# Patient Record
Sex: Female | Born: 1945
Health system: Southern US, Community
[De-identification: ages and names within clinical notes are randomized; demographics above are authoritative.]

## PROBLEM LIST (undated history)

## (undated) DIAGNOSIS — E559 Vitamin D deficiency, unspecified: Secondary | ICD-10-CM

## (undated) DIAGNOSIS — D649 Anemia, unspecified: Secondary | ICD-10-CM

## (undated) DIAGNOSIS — N200 Calculus of kidney: Secondary | ICD-10-CM

## (undated) DIAGNOSIS — I10 Essential (primary) hypertension: Secondary | ICD-10-CM

## (undated) DIAGNOSIS — K649 Unspecified hemorrhoids: Secondary | ICD-10-CM

## (undated) DIAGNOSIS — R0989 Other specified symptoms and signs involving the circulatory and respiratory systems: Secondary | ICD-10-CM

## (undated) DIAGNOSIS — E785 Hyperlipidemia, unspecified: Secondary | ICD-10-CM

## (undated) DIAGNOSIS — D126 Benign neoplasm of colon, unspecified: Secondary | ICD-10-CM

## (undated) DIAGNOSIS — M1990 Unspecified osteoarthritis, unspecified site: Secondary | ICD-10-CM

## (undated) HISTORY — DX: Unspecified hemorrhoids: K64.9

## (undated) HISTORY — DX: Calculus of kidney: N20.0

## (undated) HISTORY — DX: Vitamin D deficiency, unspecified: E55.9

## (undated) HISTORY — PX: COLONOSCOPY: SHX174

## (undated) HISTORY — DX: Essential (primary) hypertension: I10

## (undated) HISTORY — PX: DENTAL SURGERY: SHX609

## (undated) HISTORY — DX: Unspecified osteoarthritis, unspecified site: M19.90

## (undated) HISTORY — DX: Other specified symptoms and signs involving the circulatory and respiratory systems: R09.89

## (undated) HISTORY — DX: Benign neoplasm of colon, unspecified: D12.6

## (undated) HISTORY — DX: Anemia, unspecified: D64.9

## (undated) HISTORY — DX: Hyperlipidemia, unspecified: E78.5

---

## 2000-08-08 ENCOUNTER — Encounter: Admission: RE | Admit: 2000-08-08 | Discharge: 2000-08-08 | Payer: Self-pay | Admitting: Internal Medicine

## 2005-01-04 ENCOUNTER — Ambulatory Visit: Payer: Self-pay | Admitting: Pulmonary Disease

## 2005-01-25 ENCOUNTER — Ambulatory Visit: Payer: Self-pay | Admitting: Pulmonary Disease

## 2006-03-28 ENCOUNTER — Ambulatory Visit: Payer: Self-pay | Admitting: Pulmonary Disease

## 2006-04-11 ENCOUNTER — Ambulatory Visit: Payer: Self-pay | Admitting: Pulmonary Disease

## 2007-07-24 ENCOUNTER — Telehealth (INDEPENDENT_AMBULATORY_CARE_PROVIDER_SITE_OTHER): Payer: Self-pay | Admitting: *Deleted

## 2007-07-25 ENCOUNTER — Encounter: Payer: Self-pay | Admitting: Pulmonary Disease

## 2007-08-21 ENCOUNTER — Ambulatory Visit: Payer: Self-pay | Admitting: Pulmonary Disease

## 2007-08-21 DIAGNOSIS — N200 Calculus of kidney: Secondary | ICD-10-CM | POA: Insufficient documentation

## 2007-08-21 DIAGNOSIS — D649 Anemia, unspecified: Secondary | ICD-10-CM | POA: Insufficient documentation

## 2007-08-21 DIAGNOSIS — M1990 Unspecified osteoarthritis, unspecified site: Secondary | ICD-10-CM | POA: Insufficient documentation

## 2007-08-21 DIAGNOSIS — I10 Essential (primary) hypertension: Secondary | ICD-10-CM | POA: Insufficient documentation

## 2008-07-15 ENCOUNTER — Telehealth (INDEPENDENT_AMBULATORY_CARE_PROVIDER_SITE_OTHER): Payer: Self-pay | Admitting: *Deleted

## 2008-11-25 ENCOUNTER — Ambulatory Visit: Payer: Self-pay | Admitting: Pulmonary Disease

## 2008-11-25 LAB — CONVERTED CEMR LAB
ALT: 28 units/L (ref 0–35)
AST: 22 units/L (ref 0–37)
BUN: 16 mg/dL (ref 6–23)
Basophils Absolute: 0 10*3/uL (ref 0.0–0.1)
Bilirubin, Direct: 0.1 mg/dL (ref 0.0–0.3)
Calcium: 9.3 mg/dL (ref 8.4–10.5)
Creatinine, Ser: 1.1 mg/dL (ref 0.4–1.2)
Eosinophils Relative: 3.3 % (ref 0.0–5.0)
GFR calc non Af Amer: 64.93 mL/min (ref >60)
Glucose, Bld: 95 mg/dL (ref 70–99)
Lymphs Abs: 1.7 10*3/uL (ref 0.7–4.0)
Monocytes Absolute: 0.5 10*3/uL (ref 0.1–1.0)
Monocytes Relative: 11 % (ref 3.0–12.0)
Neutrophils Relative %: 49.2 % (ref 43.0–77.0)
Platelets: 284 10*3/uL (ref 150.0–400.0)
RDW: 13.2 % (ref 11.5–14.6)
TSH: 0.97 microintl units/mL (ref 0.35–5.50)
Total Bilirubin: 0.4 mg/dL (ref 0.3–1.2)
WBC: 4.7 10*3/uL (ref 4.5–10.5)

## 2008-12-20 ENCOUNTER — Encounter: Payer: Self-pay | Admitting: Pulmonary Disease

## 2008-12-23 ENCOUNTER — Telehealth: Payer: Self-pay | Admitting: Pulmonary Disease

## 2008-12-31 LAB — CONVERTED CEMR LAB: Vit D, 25-Hydroxy: 26 ng/mL — ABNORMAL LOW (ref 30–89)

## 2009-01-06 ENCOUNTER — Ambulatory Visit: Payer: Self-pay | Admitting: Internal Medicine

## 2009-03-03 ENCOUNTER — Encounter: Payer: Self-pay | Admitting: Internal Medicine

## 2009-03-03 ENCOUNTER — Ambulatory Visit: Payer: Self-pay | Admitting: Internal Medicine

## 2009-03-06 ENCOUNTER — Encounter: Payer: Self-pay | Admitting: Internal Medicine

## 2009-03-21 DIAGNOSIS — Z860100 Personal history of colon polyps, unspecified: Secondary | ICD-10-CM | POA: Insufficient documentation

## 2009-05-26 ENCOUNTER — Ambulatory Visit: Payer: Self-pay | Admitting: Pulmonary Disease

## 2009-05-26 DIAGNOSIS — K649 Unspecified hemorrhoids: Secondary | ICD-10-CM | POA: Insufficient documentation

## 2009-05-26 DIAGNOSIS — E785 Hyperlipidemia, unspecified: Secondary | ICD-10-CM | POA: Insufficient documentation

## 2009-05-26 DIAGNOSIS — E559 Vitamin D deficiency, unspecified: Secondary | ICD-10-CM | POA: Insufficient documentation

## 2009-05-26 DIAGNOSIS — D126 Benign neoplasm of colon, unspecified: Secondary | ICD-10-CM | POA: Insufficient documentation

## 2009-05-26 HISTORY — DX: Unspecified hemorrhoids: K64.9

## 2009-06-09 ENCOUNTER — Telehealth: Payer: Self-pay | Admitting: Pulmonary Disease

## 2009-11-24 ENCOUNTER — Ambulatory Visit: Payer: Self-pay | Admitting: Pulmonary Disease

## 2009-11-25 LAB — CONVERTED CEMR LAB
ALT: 27 units/L (ref 0–35)
AST: 24 units/L (ref 0–37)
Basophils Absolute: 0 10*3/uL (ref 0.0–0.1)
Bilirubin, Direct: 0.1 mg/dL (ref 0.0–0.3)
CO2: 31 meq/L (ref 19–32)
Chloride: 106 meq/L (ref 96–112)
Cholesterol: 168 mg/dL (ref 0–200)
Creatinine, Ser: 1.2 mg/dL (ref 0.4–1.2)
Eosinophils Absolute: 0.1 10*3/uL (ref 0.0–0.7)
HDL: 38.3 mg/dL — ABNORMAL LOW (ref >39.00)
Lymphocytes Relative: 35 % (ref 12.0–46.0)
MCHC: 34.4 g/dL (ref 30.0–36.0)
MCV: 86.7 fL (ref 78.0–100.0)
Monocytes Absolute: 0.6 10*3/uL (ref 0.1–1.0)
Neutrophils Relative %: 48.8 % (ref 43.0–77.0)
Platelets: 288 10*3/uL (ref 150.0–400.0)
Potassium: 4.2 meq/L (ref 3.5–5.1)
RBC: 3.55 M/uL — ABNORMAL LOW (ref 3.87–5.11)
RDW: 14.6 % (ref 11.5–14.6)
Saturation Ratios: 31 % (ref 20.0–50.0)
Total Bilirubin: 0.5 mg/dL (ref 0.3–1.2)
Total CHOL/HDL Ratio: 4
Total Protein: 7.8 g/dL (ref 6.0–8.3)
Transferrin: 207.7 mg/dL — ABNORMAL LOW (ref 212.0–360.0)
Triglycerides: 66 mg/dL (ref 0.0–149.0)
Vit D, 25-Hydroxy: 45 ng/mL (ref 30–89)

## 2009-12-19 ENCOUNTER — Encounter: Payer: Self-pay | Admitting: Pulmonary Disease

## 2009-12-22 ENCOUNTER — Encounter: Payer: Self-pay | Admitting: Pulmonary Disease

## 2009-12-22 ENCOUNTER — Ambulatory Visit: Payer: Self-pay

## 2010-05-25 ENCOUNTER — Ambulatory Visit: Payer: Self-pay | Admitting: Pulmonary Disease

## 2010-05-25 DIAGNOSIS — R0989 Other specified symptoms and signs involving the circulatory and respiratory systems: Secondary | ICD-10-CM | POA: Insufficient documentation

## 2010-09-03 NOTE — Assessment & Plan Note (Signed)
Summary: 6 MONTHS/ MBW   Primary Care Provider:  Alroy Dust, MD  CC:  6 month ROV & review of mult medical problems....  History of Present Illness: 65 y/o BF here for a folllow up visit... she is [name] sister & followed for problems below...    ~  Apr10:  she is very intermittent w/ her ROV's and w/ her med refills... I called her Pharm- [facility] on [address]: Rx for both BP meds filled 7/29, 10/12, 11/19, 12/30, 2/1, 3/9... pt advised to refill meds on time and take them daily...   ~  May 26, 2009:  she has been taking her BP meds regularly & well controlled at [facility] checks & here today... she had her colonoscopy 8/10 w/ 15mm tubulovillous adenoma removed & I congratulated her on getting the test done "in the nick of time"... she still needs GYN eval for Pap, mammogram, BMD & she promises to get these done as well...   ~  November 24, 2009:  she hasn't yet seen her GYN for Pap, Mammogram, BMD (she promises to get these done- offered to sched Mammogram & BMD, but she declined & will get these done w/ GYN ASAP)... feeling well w/o new complaints or concerns... states she's taking meds regularly & BP is controlled on Rx;  FLP looks fair- we discussed low chol/ low fat diet;  mild anemia- continue Fe & Vits... she needs CDoppler screen- to be arranged.    Current Problems:   HYPERTENSION (ICD-401.9) - on METOPROLOL ER 50mg /d + LISINOPRIL/ HCT 20-12.5 daily... states she checks BP at the pharm and they are all OK... BP= 140/82 & reminded to take meds regularly... denies HA, fatigue, visual changes, CP, palipit, dizziness, syncope, dyspnea, edema, etc...   CAROTID BRUIT, LEFT (ICD-785.9) - on ASA 81mg /d... pt never went for her CDoppler that we scheduled in 2007... we discussed this & rec that she proceed w/ this screening test...  ~  CDoppler 4/11 =   HYPERCHOLESTEROLEMIA, BORDERLINE (ICD-272.4) - she is on a low cholesterol, low fat diet...  ~  FLP 9/07 showed TChol 166, TG 51, HDL 34,  LDL 121  ~  FLP 4/11 showed TChol 168, TG 66, HDL 38, LDL 117... discussed better diet, incr exercise.  COLONIC POLYPS (ICD-211.3) & HEMORRHOIDS - she had colonoscopy 8/10 by DrGessner w/ 15mm polyp at splenic flex= tubulovillous adenoma & f/u planned 60yrs... also had some hemorroids noted...  Hx of RENAL CALCULUS (ICD-592.0) - Rx by DrNesi in the past...  OSTEOARTHRITIS (ICD-715.90) - she uses OTC Tylenol arthritis...  VITAMIN D DEFICIENCY (ICD-268.9) - on Vit D 1000 u OTC daily.  ~  Vit D level 4/10 = 26... started on OTC Vit D 1000 u daily.  ~  labs 4/11 showed Vit D level = 45... continue OTC Rx.  ANEMIA (ICD-285.9) - last labs here 9/07 showed Hg= 11.1, MCV 87, Fe= 48 w/ Sat 16%... rec to take OTC Fe daily but not doing this any longer and hasn't had f/u blood work...   ~  labs 4/10 showed Hg= 11.3, MCV= 86... rec> continue Fe supplement daily.  ~  labs 4/11 showed Hg= 10.6, MCV= 87, Fe= 90 (30%)  HEALTH MAINTENANCE:  She needs GYN doctor and Pap, Mammogram, Baseline BMD, etc... she has promised to get a gynecologist for these important tests... finally had her colonoscopy w/ adenoma found & removed...   Allergies (verified): No Known Drug Allergies  Comments:  Nurse/Medical Assistant: The patient's medications  and allergies were reviewed with the patient and were updated in the Medication and Allergy Lists.  Past History:  Past Medical History: HYPERTENSION (ICD-401.9) CAROTID BRUIT, LEFT (ICD-785.9) HYPERCHOLESTEROLEMIA, BORDERLINE (ICD-272.4) COLONIC POLYPS (ICD-211.3) HEMORRHOIDS (ICD-455.6) Hx of RENAL CALCULUS (ICD-592.0) OSTEOARTHRITIS (ICD-715.90) VITAMIN D DEFICIENCY (ICD-268.9) ANEMIA (ICD-285.9)  Family History: Reviewed history from 05/26/2009 and no changes required. Father alive but estranged... Mother alive, age 9 hx DM, HBP... 2 Siblings-  1 Sister= [name], hx HBP, Obese... 1 Brother, Onalee Hua w/ HBP, hx stroke...  No FH of Colon  Cancer: Family History of Colon Polyps: Mother Family History of Diabetes: Mother  Social History: Reviewed history from 05/26/2009 and no changes required. Married, but separated... 2 Sons- Never smoked Social alcohol Employed at [employer]  Review of Systems      See HPI       The patient complains of dyspnea on exertion.  The patient denies anorexia, fever, weight loss, weight gain, vision loss, decreased hearing, hoarseness, chest pain, syncope, peripheral edema, prolonged cough, headaches, hemoptysis, abdominal pain, melena, hematochezia, severe indigestion/heartburn, hematuria, incontinence, muscle weakness, suspicious skin lesions, transient blindness, difficulty walking, depression, unusual weight change, abnormal bleeding, enlarged lymph nodes, and angioedema.    Vital Signs:  Patient profile:   65 year old female Height:      62 inches Weight:      175.13 pounds O2 Sat:      96 % on Room air Temp:     96.8 degrees F oral Pulse rate:   66 / minute BP sitting:   140 / 82  (right arm) Cuff size:   regular  Vitals Entered By: Randell Loop CMA (November 24, 2009 8:56 AM)  O2 Sat at Rest %:  96 O2 Flow:  Room air CC: 6 month ROV & review of mult medical problems... Is Patient Diabetic? No Pain Assessment Patient in pain? no      Comments NO CHANGES IN MEDS   Physical Exam  Additional Exam:  WD, WN, 64 y/o BF in NAD...  GENERAL:  Alert & oriented; pleasant & cooperative. HEENT:  /AT, EOM-wnl, PERRLA, EACs-clear, TMs-wnl, NOSE-clear, THROAT-clear & wnl. NECK:  Supple w/ fairROM; no JVD; sl diminished carotid impulses w/ faint L bruit, no thyromegaly or nodules palpated; no lymphadenopathy. CHEST:  Clear to P & A; without wheezes/ rales/ or rhonchi. HEART:  Regular Rhythm; without murmurs/ rubs/ or gallops. ABDOMEN:  Soft & nontender; normal bowel sounds; no organomegaly or masses detected. EXT: without deformities, mild arthritic changes; no varicose  veins/ venous insuffic/ or edema. NEURO:  CN's intact; motor testing normal; sensory testing normal; gait normal & balance OK. DERM:  No lesions noted; no rash etc...    MISC. Report  Procedure date:  11/24/2009  Findings:      BMP (METABOL)   Sodium                    145 mEq/L                   135-145   Potassium                 4.2 mEq/L                   3.5-5.1   Chloride                  106 mEq/L  96-112   Carbon Dioxide            31 mEq/L                    19-32   Glucose                   91 mg/dL                    16-10   BUN                       19 mg/dL                    9-60   Creatinine                1.2 mg/dL                   4.5-4.0   Calcium                   9.5 mg/dL                   9.8-11.9   GFR                       48.37 mL/min                >60  Hepatic/Liver Function Panel (HEPATIC)   Total Bilirubin           0.5 mg/dL                   1.4-7.8   Direct Bilirubin          0.1 mg/dL                   2.9-5.6   Alkaline Phosphatase      70 U/L                      39-117   AST                       24 U/L                      0-37   ALT                       27 U/L                      0-35   Total Protein             7.8 g/dL                    2.1-3.0   Albumin                   3.6 g/dL                    8.6-5.7  CBC Platelet w/Diff (CBCD)   White Cell Count          4.8 K/uL                    4.5-10.5   Red Cell Count       [L]  3.55 Mil/uL  3.87-5.11   Hemoglobin           [L]  10.6 g/dL                   09.8-11.9   Hematocrit           [L]  30.8 %                      36.0-46.0   MCV                       86.7 fl                     78.0-100.0   Platelet Count            288.0 K/uL                  150.0-400.0   Neutrophil %              48.8 %                      43.0-77.0   Lymphocyte %              35.0 %                      12.0-46.0   Monocyte %           [H]  12.3 %                       3.0-12.0   Eosinophils%              3.1 %                       0.0-5.0   Basophils %               0.8 %                       0.0-3.0  Comments:      Lipid Panel (LIPID)   Cholesterol               168 mg/dL                   1-478   Triglycerides             66.0 mg/dL                  2.9-562.1   HDL                  [L]  30.86 mg/dL                 >57.84   LDL Cholesterol      [H]  696 mg/dL                   2-95  TSH (TSH)   FastTSH                   0.87 uIU/mL                 0.35-5.50   IBC Panel (IBC)   Iron                      90 ug/dL  42-145   Transferrin          [L]  207.7 mg/dL                 161.0-960.4   Iron Saturation           31.0 %                      20.0-50.0  Vitamin D (25-Hydroxy) (54098)  Vitamin D (25-Hydroxy)                             45 ng/mL                    30-89   Impression & Recommendations:  Problem # 1:  HYPERTENSION (ICD-401.9) Controlled-  same meds... Her updated medication list for this problem includes:    Metoprolol Succinate 50 Mg Tb24 (Metoprolol succinate) .Marland Kitchen... 1 tab by mouth once daily    Lisinopril-hydrochlorothiazide 20-12.5 Mg Tabs (Lisinopril-hydrochlorothiazide) .Marland Kitchen... Take 1 tablet by mouth once a day  Orders: Prescription Created Electronically [phone]) TLB-BMP (Basic Metabolic Panel-BMET) (80048-METABOL) TLB-Hepatic/Liver Function Pnl (80076-HEPATIC) TLB-CBC Platelet - w/Differential (85025-CBCD) TLB-Lipid Panel (80061-LIPID) TLB-TSH (Thyroid Stimulating Hormone) (84443-TSH) TLB-IBC Pnl (Iron/FE;Transferrin) (83550-IBC) T-Vitamin D (25-Hydroxy) (78295-62130)  Problem # 2:  CAROTID BRUIT, LEFT (ICD-785.9) Needs CDoppler-  to be scheduled... Orders: Carotid Doppler (Carotid doppler)  Problem # 3:  HYPERCHOLESTEROLEMIA, BORDERLINE (ICD-272.4) Labs not too bad on diet alone-  needs to get weeight down!  Problem # 4:  COLONIC POLYPS (ICD-211.3) GI per DrGessner-  f/u colon 3  yrs.  Problem # 5:  VITAMIN D DEFICIENCY (ICD-268.9) Stable-  continue OTC Vit D...  Problem # 6:  ANEMIA (ICD-285.9) She has ACD-  continue Fe & Vits... Her updated medication list for this problem includes:    Slow Fe 142 (45 Fe) Mg Cr-tabs (Ferrous sulfate) .Marland Kitchen... Take 1 tab daily...  Complete Medication List: 1)  Aspirin Adult Low Strength 81 Mg Tbec (Aspirin) .... Take 1 tab by mouth once daily.Marland KitchenMarland Kitchen 2)  Metoprolol Succinate 50 Mg Tb24 (Metoprolol succinate) .Marland Kitchen.. 1 tab by mouth once daily 3)  Lisinopril-hydrochlorothiazide 20-12.5 Mg Tabs (Lisinopril-hydrochlorothiazide) .... Take 1 tablet by mouth once a day 4)  Vitamin D 1000 Unit Tabs (Cholecalciferol) .... Take 1 tablet by mouth once a day 5)  Slow Fe 142 (45 Fe) Mg Cr-tabs (Ferrous sulfate) .... Take 1 tab daily...  Patient Instructions: 1)  Today we updated your med list- see below.... 2)  We refilled your meds for 2011... 3)  Today we did your FASTING blood work...  please call the "phone tree" in a few days for your lab results.Marland KitchenMarland Kitchen 4)  We will also sched the screening carotid doppler exam (sound wave test of the blood flow in your neck arteries) & we will call w/ these results when avail.Marland KitchenMarland Kitchen 5)  Let's get on track w/ our diet + exercise program... the goal is to lose 15-20 lbs!!! 6)  Call for any questions.Marland KitchenMarland Kitchen 7)  Please schedule a follow-up appointment in 6 months. Prescriptions: LISINOPRIL-HYDROCHLOROTHIAZIDE 20-12.5 MG TABS (LISINOPRIL-HYDROCHLOROTHIAZIDE) Take 1 tablet by mouth once a day  #90 x prn   Entered and Authorized by:   Michele Mcalpine MD   Signed by:   Michele Mcalpine MD on 11/24/2009   Method used:   Print then Give to Patient   RxID:   [id number] METOPROLOL SUCCINATE 50 MG  TB24 (  METOPROLOL SUCCINATE) 1 tab by mouth once daily  #90 x prn   Entered and Authorized by:   Michele Mcalpine MD   Signed by:   Michele Mcalpine MD on 11/24/2009   Method used:   Print then Give to Patient   RxID:    [id number]    Immunization History:  Influenza Immunization History:    Influenza:  historical (06/18/2009)

## 2010-09-03 NOTE — Assessment & Plan Note (Signed)
Summary: 6 MONTH FOLLOW UP/LA   Primary Care Kellie Murrill:  Alroy Dust, MD  CC:  6 month ROV & review of mult medical problems....  History of Present Illness: 65 y/o BF here for a folllow up visit... she is [name] sister & followed for problems below...    ~  Apr10:  she has been very intermittent w/ her ROV's and w/ her med refills... I called her Pharm- [facility] on [address]: Rx for both BP meds filled 7/29, 10/12, 11/19, 12/30, 2/1, 3/9... pt advised to refill meds on time and take them daily...   ~  May 26, 2009:  she has been taking her BP meds regularly & well controlled at [facility] checks & here today... she had her colonoscopy 8/10 w/ 15mm tubulovillous adenoma removed & I congratulated her on getting the test done in a timely manner... she still needs GYN eval for Pap, mammogram, BMD & she promises to get these done as well...   ~  November 24, 2009:  she hasn't yet seen her GYN for Pap, Mammogram, BMD (she promises to get these done- offered to sched Mammogram & BMD, but she declined & will get these done w/ GYN ASAP)... feeling well w/o new complaints or concerns... states she's taking meds regularly & BP is controlled on Rx;  FLP looks fair- we discussed low chol/ low fat diet;  mild anemia- continue Fe & Vits... she needs CDoppler screen- done 5/11 & showed mild smooth plaque on the right w/ 40-59% RICA stenosis & normal on left (f/u rec 67yrs)...   ~  May 25, 2010:  she reports a good 21mo- only c/o some intermittent right knee pain when she has to stand for prolonged periods (rec to try knee brace & Aleve Prn)... BP stable on meds;  weight down 3# on diet, but still needs incr exercise, etc... she hasn't contacted DrNeal for GYN check up yet but promises to do this very soon...    Current Problems:   HYPERTENSION (ICD-401.9) - on METOPROLOL ER 50mg /d + LISINOPRIL 20mg /d, & HCTZ 12.5mg  daily... states she checks BP at the pharm and they are all OK... BP= 146/78 & reminded to  take meds regularly... denies HA, fatigue, visual changes, CP, palipit, dizziness, syncope, dyspnea, edema, etc...   CAROTID BRUIT (ICD-785.9) - on ASA 81mg /d... pt never went for her CDoppler that we scheduled in 2007... we discussed this & rec that she proceed w/ this screening test...  ~  CDoppler 5/11 showed mild smooth plaque on the right w/ 40-59% RICA stenosis & normal on left (f/u rec 65yrs)  HYPERCHOLESTEROLEMIA, BORDERLINE (ICD-272.4) - she is on a low cholesterol, low fat diet...  ~  FLP 9/07 showed TChol 166, TG 51, HDL 34, LDL 121  ~  FLP 4/11 showed TChol 168, TG 66, HDL 38, LDL 117... discussed better diet, incr exercise.  COLONIC POLYPS (ICD-211.3) & HEMORRHOIDS - she had colonoscopy 8/10 by DrGessner w/ 15mm polyp at splenic flex= tubulovillous adenoma & f/u planned 70yrs... also had some hemorroids noted...  Hx of RENAL CALCULUS (ICD-592.0) - Rx by DrNesi in the past...  OSTEOARTHRITIS (ICD-715.90) - she uses OTC Tylenol arthritis & we discussed knee brace & Aleve Prn.  VITAMIN D DEFICIENCY (ICD-268.9) - on Vit D 1000 u OTC daily.  ~  Vit D level 4/10 = 26... started on OTC Vit D 1000 u daily.  ~  labs 4/11 showed Vit D level = 45... continue OTC Rx.  ANEMIA (ICD-285.9) -  on SLOW-Fe daily...  ~  labs 9/07 showed Hg= 11.1, MCV 87, Fe= 48 w/ Sat 16%... rec OTC Fe daily but she stopped on her own.  ~  labs 4/10 showed Hg= 11.3, MCV= 86... rec> continue Fe supplement daily.  ~  labs 4/11 showed Hg= 10.6, MCV= 87, Fe= 90 (30%)... rec> continue same.  HEALTH MAINTENANCE:  She needs GYN doctor and Pap, Mammogram, Baseline BMD, etc... she has promised to get a gynecologist for these important tests... finally had her colonoscopy 8/10 w/ adenoma found & removed...   Allergies: No Known Drug Allergies  Comments:  Nurse/Medical Assistant: The patient's medications and allergies were reviewed with the patient and were updated in the Medication and Allergy Lists. Boone Master  CNA/MA  May 25, 2010 12:18 PM   Past History:  Past Medical History: HYPERTENSION (ICD-401.9) CAROTID BRUIT (ICD-785.9) HYPERCHOLESTEROLEMIA, BORDERLINE (ICD-272.4) COLONIC POLYPS (ICD-211.3) w/ +FamHx colon polyps in mother HEMORRHOIDS (ICD-455.6) Hx of RENAL CALCULUS (ICD-592.0) OSTEOARTHRITIS (ICD-715.90) VITAMIN D DEFICIENCY (ICD-268.9) ANEMIA (ICD-285.9)  Past Surgical History: no prev surgery  Family History: Reviewed history from 05/26/2009 and no changes required. Father alive but estranged... Mother alive, age 34 hx DM, HBP... 2 Siblings-  1 Sister= [name], hx HBP, Obese... 1 Brother, Onalee Hua w/ HBP, hx stroke...  No FH of Colon Cancer: Family History of Colon Polyps: Mother Family History of Diabetes: Mother  Social History: Reviewed history from 05/26/2009 and no changes required. Married, but separated... 2 Sons- Never smoked Social alcohol Employed at [employer]  Review of Systems      See HPI  The patient denies anorexia, fever, weight loss, weight gain, vision loss, decreased hearing, hoarseness, chest pain, syncope, dyspnea on exertion, peripheral edema, prolonged cough, headaches, hemoptysis, abdominal pain, melena, hematochezia, severe indigestion/heartburn, hematuria, incontinence, muscle weakness, suspicious skin lesions, transient blindness, difficulty walking, depression, unusual weight change, abnormal bleeding, enlarged lymph nodes, and angioedema.    Vital Signs:  Patient profile:   65 year old female Height:      62 inches Weight:      172.25 pounds BMI:     31.62 O2 Sat:      96 % on Room air Temp:     98.4 degrees F oral Pulse rate:   85 / minute BP sitting:   146 / 78  (right arm) Cuff size:   regular  Vitals Entered By: Boone Master CNA/MA (May 25, 2010 12:18 PM)  O2 Flow:  Room air  Physical Exam  Additional Exam:  WD, WN, 65 y/o BF in NAD...  GENERAL:  Alert & oriented; pleasant &  cooperative. HEENT:  [place]/AT, EOM-wnl, PERRLA, EACs-clear, TMs-wnl, NOSE-clear, THROAT-clear & wnl. NECK:  Supple w/ fairROM; no JVD; sl diminished carotid impulses w/ faint L bruit, no thyromegaly or nodules palpated; no lymphadenopathy. CHEST:  Clear to P & A; without wheezes/ rales/ or rhonchi. HEART:  Regular Rhythm; without murmurs/ rubs/ or gallops. ABDOMEN:  Soft & nontender; normal bowel sounds; no organomegaly or masses detected. EXT: without deformities, mild arthritic changes; no varicose veins/ venous insuffic/ or edema. NEURO:  CN's intact; motor testing normal; sensory testing normal; gait normal & balance OK. DERM:  No lesions noted; no rash etc...    Impression & Recommendations:  Problem # 1:  HYPERTENSION (ICD-401.9) Controlled>  same meds, keep wt down... Her updated medication list for this problem includes:    Metoprolol Succinate 50 Mg Tb24 (Metoprolol succinate) .Marland Kitchen... 1 tab by mouth once daily  Lisinopril 20 Mg Tabs (Lisinopril) .Marland Kitchen... Take 1 tablet by mouth once a day    Hydrochlorothiazide 12.5 Mg Caps (Hydrochlorothiazide) .Marland Kitchen... Take one capsule by mouth once daily  Problem # 2:  CAROTID BRUIT  (ICD-785.9) CDoppler w/ mild sm plaque on right, 40-59%, f/u study planned in 2 yrs... continue ASA 81mg /d.  Problem # 3:  HYPERCHOLESTEROLEMIA, BORDERLINE (ICD-272.4) On diet alone & we reviewed labs and diet/ exercise recs...  Problem # 4:  COLONIC POLYPS (ICD-211.3) Up to date on colon screening... she needs to call her GYN for Pap, Mammogram, BMD...  Problem # 5:  OTHER MEDICAL PROBLEMS AS NOTED>>>  Complete Medication List: 1)  Aspirin Adult Low Strength 81 Mg Tbec (Aspirin) .... Take 1 tab by mouth once daily.Marland KitchenMarland Kitchen 2)  Metoprolol Succinate 50 Mg Tb24 (Metoprolol succinate) .Marland Kitchen.. 1 tab by mouth once daily 3)  Lisinopril 20 Mg Tabs (Lisinopril) .... Take 1 tablet by mouth once a day 4)  Hydrochlorothiazide 12.5 Mg Caps (Hydrochlorothiazide) .... Take one capsule by  mouth once daily 5)  Vitamin D 1000 Unit Tabs (Cholecalciferol) .... Take 1 tablet by mouth once a day 6)  Slow Fe 142 (45 Fe) Mg Cr-tabs (Ferrous sulfate) .... Take 1 tab daily...  Patient Instructions: 1)  Today we updated your med list- see below.... 2)  We refilled your meds per request... 3)  Continue your current meds the same... 4)  Call for any problems.Marland KitchenMarland Kitchen 5)  Please schedule a follow-up appointment in 6 months, & we will plan FASTING blood work at that time... Prescriptions: HYDROCHLOROTHIAZIDE 12.5 MG CAPS (HYDROCHLOROTHIAZIDE) take one capsule by mouth once daily  #30 x 12   Entered and Authorized by:   Michele Mcalpine MD   Signed by:   Michele Mcalpine MD on 05/25/2010   Method used:   Print then Give to Patient   RxID:   [id number] LISINOPRIL 20 MG TABS (LISINOPRIL) Take 1 tablet by mouth once a day  #30 x 12   Entered and Authorized by:   Michele Mcalpine MD   Signed by:   Michele Mcalpine MD on 05/25/2010   Method used:   Print then Give to Patient   RxID:   [id number] METOPROLOL SUCCINATE 50 MG  TB24 (METOPROLOL SUCCINATE) 1 tab by mouth once daily  #30 x 12   Entered and Authorized by:   Michele Mcalpine MD   Signed by:   Michele Mcalpine MD on 05/25/2010   Method used:   Print then Give to Patient   RxID:   [id number]    Immunization History:  Influenza Immunization History:    Influenza:  historical (05/02/2010)

## 2010-09-03 NOTE — Miscellaneous (Signed)
Summary: Orders Update  Clinical Lists Changes  Orders: Added new Test order of Carotid Duplex (Carotid Duplex) - Signed 

## 2010-11-06 ENCOUNTER — Other Ambulatory Visit: Payer: Self-pay | Admitting: Pulmonary Disease

## 2010-11-11 ENCOUNTER — Other Ambulatory Visit: Payer: Self-pay | Admitting: *Deleted

## 2010-11-11 MED ORDER — HYDROCHLOROTHIAZIDE 12.5 MG PO CAPS: 12.5000 mg | ORAL_CAPSULE | ORAL | Status: DC

## 2010-11-11 MED ORDER — LISINOPRIL 20 MG PO TABS: 20.0000 mg | ORAL_TABLET | Freq: Every day | ORAL | Status: DC

## 2010-11-11 NOTE — Telephone Encounter (Signed)
Refills have been faxed to the pharmacy for refills of meds

## 2010-11-23 ENCOUNTER — Other Ambulatory Visit: Payer: Self-pay | Admitting: Pulmonary Disease

## 2010-11-23 ENCOUNTER — Other Ambulatory Visit (INDEPENDENT_AMBULATORY_CARE_PROVIDER_SITE_OTHER): Payer: 59

## 2010-11-23 ENCOUNTER — Encounter: Payer: Self-pay | Admitting: Pulmonary Disease

## 2010-11-23 ENCOUNTER — Ambulatory Visit (INDEPENDENT_AMBULATORY_CARE_PROVIDER_SITE_OTHER): Payer: 59 | Admitting: Pulmonary Disease

## 2010-11-23 DIAGNOSIS — E785 Hyperlipidemia, unspecified: Secondary | ICD-10-CM

## 2010-11-23 DIAGNOSIS — F419 Anxiety disorder, unspecified: Secondary | ICD-10-CM

## 2010-11-23 DIAGNOSIS — I10 Essential (primary) hypertension: Secondary | ICD-10-CM

## 2010-11-23 DIAGNOSIS — D649 Anemia, unspecified: Secondary | ICD-10-CM

## 2010-11-23 DIAGNOSIS — F411 Generalized anxiety disorder: Secondary | ICD-10-CM

## 2010-11-23 DIAGNOSIS — D126 Benign neoplasm of colon, unspecified: Secondary | ICD-10-CM

## 2010-11-23 DIAGNOSIS — R0989 Other specified symptoms and signs involving the circulatory and respiratory systems: Secondary | ICD-10-CM

## 2010-11-23 LAB — CBC WITH DIFFERENTIAL/PLATELET
Basophils Relative: 2 % (ref 0.0–3.0)
Eosinophils Relative: 3.9 % (ref 0.0–5.0)
Lymphocytes Relative: 34.7 % (ref 12.0–46.0)
MCV: 89.3 fl (ref 78.0–100.0)
Neutrophils Relative %: 49.1 % (ref 43.0–77.0)
RBC: 3.8 Mil/uL — ABNORMAL LOW (ref 3.87–5.11)
WBC: 5 10*3/uL (ref 4.5–10.5)

## 2010-11-23 LAB — HEPATIC FUNCTION PANEL
ALT: 35 U/L (ref 0–35)
Total Bilirubin: 0.4 mg/dL (ref 0.3–1.2)
Total Protein: 7.8 g/dL (ref 6.0–8.3)

## 2010-11-23 LAB — IBC PANEL
Iron: 63 ug/dL (ref 42–145)
Saturation Ratios: 20.3 % (ref 20.0–50.0)
Transferrin: 221.8 mg/dL (ref 212.0–360.0)

## 2010-11-23 LAB — BASIC METABOLIC PANEL
BUN: 20 mg/dL (ref 6–23)
Calcium: 9.5 mg/dL (ref 8.4–10.5)
Chloride: 100 mEq/L (ref 96–112)
Creatinine, Ser: 1 mg/dL (ref 0.4–1.2)

## 2010-11-23 LAB — LIPID PANEL
Cholesterol: 179 mg/dL (ref 0–200)
HDL: 40 mg/dL (ref >39.00)
LDL Cholesterol: 121 mg/dL — ABNORMAL HIGH (ref 0–99)
Triglycerides: 88 mg/dL (ref 0.0–149.0)

## 2010-11-23 LAB — VITAMIN B12: Vitamin B-12: 712 pg/mL (ref 211–911)

## 2010-11-23 MED ORDER — LISINOPRIL 20 MG PO TABS: 20.0000 mg | ORAL_TABLET | Freq: Every day | ORAL | Status: DC

## 2010-11-23 MED ORDER — HYDROCHLOROTHIAZIDE 12.5 MG PO CAPS: 12.5000 mg | ORAL_CAPSULE | ORAL | Status: DC

## 2010-11-23 MED ORDER — METOPROLOL SUCCINATE ER 50 MG PO TB24: 50.0000 mg | ORAL_TABLET | Freq: Every day | ORAL | Status: DC

## 2010-11-23 NOTE — Patient Instructions (Signed)
Today we updated your med list in our EPIC system...    Continue your current meds the same, & be sure to take them every day...  Today we did your follow up fasting blood work...    Please call the PHONE TREE in a few days for your results...    Dial [id number] & when prompted enter your patient number followed by the # symbol...    Your patient number is:  [id number]  Let's get on track w/ our diet & exercise program...    The goal is to lose 10-15 lbs...  Call for any problems... Let's plan a follow up visit in another 6 months.Marland KitchenMarland Kitchen

## 2010-11-23 NOTE — Progress Notes (Signed)
Subjective:    Patient ID: Sara Rice, female    DOB: 06/08/1946, 65 y.o.   MRN: [id number]  HPI 65 y/o BF here for a folllow up visit... she is Sara Rice sister & followed for problems below...   ~  November 24, 2009:  she hasn't yet seen her GYN for Pap, Mammogram, BMD (she promises to get these done- offered to sched Mammogram & BMD, but she declined & will get these done w/ GYN ASAP)... feeling well w/o new complaints or concerns... states she's taking meds regularly & BP is controlled on Rx;  FLP looks fair- we discussed low chol/ low fat diet;  mild anemia- continue Fe & Vits... she needs CDoppler screen- done 5/11 & showed mild smooth plaque on the right w/ 40-59% RICA stenosis & normal on left (f/u rec 37yrs)...  ~  May 25, 2010:  she reports a good 72mo- only c/o some intermittent right knee pain when she has to stand for prolonged periods (rec to try knee brace & Aleve Prn)... BP stable on meds;  weight down 3# on diet, but still needs incr exercise, etc... she hasn't contacted Sara Rice for GYN check up yet but promises to do this very soon...  ~  November 23, 2010:  72mo ROV and doing well w/o new complaints or concerns just sl tired from long hrs at work [employer]);  Recently ran out of meds & we discussed taking meds regularly, all refilled today;  She still hasn't set up GYN appt, mammograms etc "I've been so busy" & she promises to set this up soon (she declines our offer to set it up for her);  Due for fasting blood work today> see below...         Problem List:    HYPERTENSION (ICD-401.9) - on METOPROLOL ER 50mg /d + LISINOPRIL 20mg /d, & HCTZ 12.5mg  daily... ~  10/11: BP= 146/78 & reminded to take meds regularly... denies HA, fatigue, visual changes, CP, palipit, dizziness, syncope, dyspnea, edema, etc...  ~  4/12:  BP= 148/90 but as noted she had run out of her meds recently; still asymptomatic 7 denies CP, palpit, SOB, edema, etc...  CAROTID BRUIT (ICD-785.9) - on ASA  81mg /d... pt never went for her CDoppler that we scheduled in 2007... we discussed this & rec that she proceed w/ this screening test... ~  CDoppler 5/11 showed mild smooth plaque on the right w/ 40-59% RICA stenosis & normal on left (f/u rec 29yrs)  HYPERCHOLESTEROLEMIA, BORDERLINE (ICD-272.4) - she is on a low cholesterol, low fat diet... ~  FLP 9/07 showed TChol 166, TG 51, HDL 34, LDL 121 ~  FLP 4/11 showed TChol 168, TG 66, HDL 38, LDL 117... discussed better diet, incr exercise. ~  FLP 4/12 showed TChol 179, TG 88, HDL 40, LDL 121  COLONIC POLYPS (ICD-211.3) & HEMORRHOIDS - she had colonoscopy 8/10 by Sara Rice w/ 15mm polyp at splenic flex= tubulovillous adenoma & f/u planned 28yrs... also had some hemorroids noted...  Hx of RENAL CALCULUS (ICD-592.0) - Rx by Sara Rice in the past...  OSTEOARTHRITIS (ICD-715.90) - she uses OTC Tylenol arthritis & we discussed knee brace & Aleve Prn.  VITAMIN D DEFICIENCY (ICD-268.9) - on Vit D 1000 u OTC daily. ~  Vit D level 4/10 = 26... started on OTC Vit D 1000 u daily. ~  labs 4/11 showed Vit D level = 45... continue OTC Rx.  ANEMIA (ICD-285.9) - on SLOW-Fe daily... ~  labs 9/07 showed Hg= 11.1, MCV  87, Fe= 48 w/ Sat 16%... rec OTC Fe daily but she stopped on her own. ~  labs 4/10 showed Hg= 11.3, MCV= 86... rec> continue Fe supplement daily. ~  labs 4/11 showed Hg= 10.6, MCV= 87, Fe= 90 (30%)... rec> continue same. ~  Labs 4/12 showed Hg= 11.4, MCV=89, Fe=63 (20%sat), B12= 712, SPE= pending.  HEALTH MAINTENANCE:  She needs GYN doctor and Pap, Mammogram, Baseline BMD, etc... she has promised to get a gynecologist for these important tests... finally had her colonoscopy 8/10 w/ adenoma found & removed (f/u due 8/13)...   No past surgical history on file.   Outpatient Encounter Prescriptions as of 11/23/2010  Medication Sig Dispense Refill  . aspirin 81 MG tablet Take 81 mg by mouth daily.        . cholecalciferol (VITAMIN D) 1000 UNITS tablet  Take 1,000 Units by mouth daily.        . Ferrous Sulfate (SLOW FE) 142 (45 FE) MG TBCR Take by mouth daily.        . hydrochlorothiazide (,MICROZIDE/HYDRODIURIL,) 12.5 MG capsule Take 1 capsule (12.5 mg total) by mouth every morning.  30 capsule  5  . lisinopril (PRINIVIL,ZESTRIL) 20 MG tablet Take 1 tablet (20 mg total) by mouth daily.  30 tablet  5  . metoprolol (TOPROL-XL) 50 MG 24 hr tablet Take 50 mg by mouth daily.          No Known Allergies   Review of Systems         See HPI - all other systems neg except as noted... The patient denies anorexia, fever, weight loss, weight gain, vision loss, decreased hearing, hoarseness, chest pain, syncope, dyspnea on exertion, peripheral edema, prolonged cough, headaches, hemoptysis, abdominal pain, melena, hematochezia, severe indigestion/heartburn, hematuria, incontinence, muscle weakness, suspicious skin lesions, transient blindness, difficulty walking, depression, unusual weight change, abnormal bleeding, enlarged lymph nodes, and angioedema.     Objective:   Physical Exam     WD, WN, 65 y/o BF in NAD...  GENERAL:  Alert & oriented; pleasant & cooperative. HEENT:  Sara Rice/AT, EOM-wnl, PERRLA, EACs-clear, TMs-wnl, NOSE-clear, THROAT-clear & wnl. NECK:  Supple w/ fairROM; no JVD; sl diminished carotid impulses w/ faint L bruit, no thyromegaly or nodules palpated; no lymphadenopathy. CHEST:  Clear to P & A; without wheezes/ rales/ or rhonchi. HEART:  Regular Rhythm; without murmurs/ rubs/ or gallops. ABDOMEN:  Soft & nontender; normal bowel sounds; no organomegaly or masses detected. EXT: without deformities, mild arthritic changes; no varicose veins/ venous insuffic/ or edema. NEURO:  CN's intact; motor testing normal; sensory testing normal; gait normal & balance OK. DERM:  No lesions noted; no rash etc...   Assessment & Plan:        HBP>  Fair control on BBlocker, ACE, Diuretic as above;  May need incr dosing but hard to tell w/  compliance issues;  We discussed taking meds regularly every day & monitoring BP at home & work;  She will call if BP >150/90 so we can increase meds...  CBruit>  On ASA, no cerebral ischemic symptoms, continue Rx...  CHOL>  On diet alone but LDL still sl up & needs better diet, incr exercise & esp to get wt down...  Colon polyp>  Stable w/o GI symptoms;  Due for f/u of her tubulovillous adenoma 8/13...  DJD>  States she's doing satis w/ OTC meds...  Anemia>  Improved/ stable on Fe + MVI etc, additional w/u in progress...  ...

## 2010-11-25 LAB — IGG, IGA, IGM
IgA: 247 mg/dL (ref 68–378)
IgM, Serum: 217 mg/dL (ref 60–263)

## 2010-11-25 LAB — PROTEIN ELECTROPHORESIS, SERUM, WITH REFLEX
Albumin ELP: 48.3 % — ABNORMAL LOW (ref 55.8–66.1)
Total Protein, Serum Electrophoresis: 8.4 g/dL — ABNORMAL HIGH (ref 6.0–8.3)

## 2011-05-31 ENCOUNTER — Encounter: Payer: Self-pay | Admitting: Pulmonary Disease

## 2011-05-31 ENCOUNTER — Ambulatory Visit (INDEPENDENT_AMBULATORY_CARE_PROVIDER_SITE_OTHER): Payer: 59 | Admitting: Pulmonary Disease

## 2011-05-31 DIAGNOSIS — N200 Calculus of kidney: Secondary | ICD-10-CM

## 2011-05-31 DIAGNOSIS — D126 Benign neoplasm of colon, unspecified: Secondary | ICD-10-CM

## 2011-05-31 DIAGNOSIS — D649 Anemia, unspecified: Secondary | ICD-10-CM

## 2011-05-31 DIAGNOSIS — K649 Unspecified hemorrhoids: Secondary | ICD-10-CM

## 2011-05-31 DIAGNOSIS — E785 Hyperlipidemia, unspecified: Secondary | ICD-10-CM

## 2011-05-31 DIAGNOSIS — M1990 Unspecified osteoarthritis, unspecified site: Secondary | ICD-10-CM

## 2011-05-31 DIAGNOSIS — E559 Vitamin D deficiency, unspecified: Secondary | ICD-10-CM

## 2011-05-31 DIAGNOSIS — R0989 Other specified symptoms and signs involving the circulatory and respiratory systems: Secondary | ICD-10-CM

## 2011-05-31 DIAGNOSIS — Z23 Encounter for immunization: Secondary | ICD-10-CM

## 2011-05-31 DIAGNOSIS — I10 Essential (primary) hypertension: Secondary | ICD-10-CM

## 2011-05-31 NOTE — Patient Instructions (Signed)
Today we updated your med list in our EPIC system...    Continue your current medications the same...  We gave you the 2012 flu vaccine today...  Call for any problems...  Let's plan a recheck in about 6 month's time & we will plan to do FASTING blood work at that time.

## 2011-05-31 NOTE — Progress Notes (Signed)
Subjective:    Patient ID: Sara Rice, female    DOB: January 07, 1946, 65 y.o.   MRN: [id number]  HPI 65 y/o BF here for a folllow up visit... she is Sara Rice sister & she works at the [employer]...   ~  Apr10:  she has been very intermittent w/ her ROV's and w/ her med refills... I called her Pharm- [facility] on [address]: Rx for both BP meds filled 7/29, 10/12, 11/19, 12/30, 2/1, 3/9... pt advised to refill meds on time and take them daily...  ~  May 26, 2009:  she has been taking her BP meds regularly & well controlled at [facility] checks & here today... she had her colonoscopy 8/10 w/ 15mm tubulovillous adenoma removed & I congratulated her on getting the test done in a timely manner... she still needs GYN eval for Pap, mammogram, BMD & she promises to get these done as well...  ~  November 24, 2009:  she hasn't yet seen her GYN for Pap, Mammogram, BMD (she promises to get these done- offered to sched Mammogram & BMD, but she declined & will get these done w/ GYN ASAP)... feeling well w/o new complaints or concerns... states she's taking meds regularly & BP is controlled on Rx;  FLP looks fair- we discussed low chol/ low fat diet;  mild anemia- continue Fe & Vits... she needs CDoppler screen- done 5/11 & showed mild smooth plaque on the right w/ 40-59% RICA stenosis & normal on left (f/u rec 65yrs)...  ~  May 25, 2010:  she reports a good 63mo- only c/o some intermittent right knee pain when she has to stand for prolonged periods (rec to try knee brace & Aleve Prn)... BP stable on meds;  weight down 3# on diet, but still needs incr exercise, etc... she hasn't contacted DrNeal for GYN check up yet but promises to do this very soon...  ~  November 23, 2010: 81mo ROV and doing well w/o new complaints or concerns just sl tired from long hrs at work [employer]); Recently ran out of meds & we discussed taking meds regularly, all refilled today; She still hasn't set up GYN appt, mammograms etc "I've been so  busy" & she promises to set this up soon (she declines our offer to set it up for her); Due for fasting blood work today> see below...   ~  May 31, 2011:  63mo ROV & she continues to do well- no new complaints or concerns, just some aches and pains for which she takes OTC meds w/ elief...     HBP> on MetoprololER50, Lisinopril20, HCT12.5; BP= 130/82 & feels well- denies CP palpit, dizzy, SOB, or edema...    Right Carotid Bruit> on ASA81; CDoppler 5/11 w/ 40-59% right ICA stenosis, and no cerebral ischemic symptoms...    Chol> on diet alone; last FLP 4/11 showed LDL 117; she is not fasting today...    GI> Polyps, Hems> colon 8/10 by DrGessner w/ 15mm tubular adenoma removed.    Hx Kidney stone> no problem in many yrs, prev eval by DrNesi...    DJD> she uses OTC analgesics as needed;     Vit D Defic> on Calcium, MVI, Vit D 1000u daily;     Anemia> on FeSO4 daily;           Problem List:   HYPERTENSION (ICD-401.9) - on METOPROLOL ER 50mg /d + LISINOPRIL 20mg /d, & HCTZ 12.5mg  daily... states she checks BP at the pharm and they are  all OK...  ~  10/11:  BP= 146/78 & reminded to take meds regularly... denies HA, fatigue, visual changes, CP, palipit, dizziness, syncope, dyspnea, edema, etc...  ~  4/12:  BP= 148/90 but as noted she had run out of her meds recently; still asymptomatic & denies CP, palpit, SOB, edema, etc... ~  10/12:  BP= 130/82 & she remains asymptomatic, continue same meds.  CAROTID BRUIT (ICD-785.9) - on ASA 81mg /d... pt never went for her CDoppler that we scheduled in 2007... we discussed this & rec that she proceed w/ this screening test... ~  CDoppler 5/11 showed mild smooth plaque on the right w/ 40-59% RICA stenosis & normal on left (f/u rec 65yrs)  HYPERCHOLESTEROLEMIA, BORDERLINE (ICD-272.4) - she is on a low cholesterol, low fat diet... ~  FLP 9/07 showed TChol 166, TG 51, HDL 34, LDL 121 ~  FLP 4/11 showed TChol 168, TG 66, HDL 38, LDL 117... discussed better diet, incr  exercise. ~  FLP 4/12 showed TChol 179, TG 88, HDL 40, LDL 121  COLONIC POLYPS (ICD-211.3) & HEMORRHOIDS - she had colonoscopy 8/10 by DrGessner w/ 15mm polyp at splenic flex= tubulovillous adenoma & f/u planned 56yrs... also had some hemorroids noted...  Hx of RENAL CALCULUS (ICD-592.0) - Rx by DrNesi in the past...  OSTEOARTHRITIS (ICD-715.90) - she uses OTC Tylenol arthritis & we discussed knee brace & Aleve Prn.  VITAMIN D DEFICIENCY (ICD-268.9) - on Vit D 1000 u OTC daily. ~  Vit D level 4/10 = 26... started on OTC Vit D 1000 u daily. ~  labs 4/11 showed Vit D level = 45... continue OTC Rx.  ANEMIA (ICD-285.9) - on SLOW-Fe daily... ~  labs 9/07 showed Hg= 11.1, MCV 87, Fe= 48 w/ Sat 16%... rec OTC Fe daily but she stopped on her own. ~  labs 4/10 showed Hg= 11.3, MCV= 86... rec> continue Fe supplement daily. ~  labs 4/11 showed Hg= 10.6, MCV= 87, Fe= 90 (30%)... rec> continue same. ~  Labs 4/12 showed Hg= 11.4, MCV=89, Fe=63 (20%sat), B12= 712, SPE= pending.  HEALTH MAINTENANCE:  She needs GYN doctor and Pap, Mammogram, Baseline BMD, etc... she has promised to get a gynecologist for these important tests... finally had her colonoscopy 8/10 w/ adenoma found & removed...   No past surgical history on file.   Outpatient Encounter Prescriptions as of 05/31/2011  Medication Sig Dispense Refill  . aspirin 81 MG tablet Take 81 mg by mouth daily.        . cholecalciferol (VITAMIN D) 1000 UNITS tablet Take 1,000 Units by mouth daily.        . Ferrous Sulfate (SLOW FE) 142 (45 FE) MG TBCR Take by mouth daily.        . hydrochlorothiazide (,MICROZIDE/HYDRODIURIL,) 12.5 MG capsule Take 1 capsule (12.5 mg total) by mouth every morning.  30 capsule  11  . lisinopril (PRINIVIL,ZESTRIL) 20 MG tablet Take 1 tablet (20 mg total) by mouth daily.  30 tablet  11  . metoprolol (TOPROL-XL) 50 MG 24 hr tablet Take 1 tablet (50 mg total) by mouth daily.  30 tablet  11    No Known Allergies   Current  Medications, Allergies, Past Medical History, Past Surgical History, Family History, and Social History were reviewed in [employer] record.    Review of Systems        See HPI - all other systems neg except as noted...  The patient denies anorexia, fever, weight loss, weight gain,  vision loss, decreased hearing, hoarseness, chest pain, syncope, dyspnea on exertion, peripheral edema, prolonged cough, headaches, hemoptysis, abdominal pain, melena, hematochezia, severe indigestion/heartburn, hematuria, incontinence, muscle weakness, suspicious skin lesions, transient blindness, difficulty walking, depression, unusual weight change, abnormal bleeding, enlarged lymph nodes, and angioedema.    Objective:   Physical Exam    WD, WN, 65 y/o BF in NAD...  GENERAL: Alert & oriented; pleasant & cooperative.  HEENT: [place]/AT, EOM-wnl, PERRLA, EACs-clear, TMs-wnl, NOSE-clear, THROAT-clear & wnl.  NECK: Supple w/ fairROM; no JVD; sl diminished carotid impulses w/ faint L bruit, no thyromegaly or nodules palpated; no lymphadenopathy.  CHEST: Clear to P & A; without wheezes/ rales/ or rhonchi.  HEART: Regular Rhythm; without murmurs/ rubs/ or gallops.  ABDOMEN: Soft & nontender; normal bowel sounds; no organomegaly or masses detected.  EXT: without deformities, mild arthritic changes; no varicose veins/ venous insuffic/ or edema.  NEURO: CN's intact; motor testing normal; sensory testing normal; gait normal & balance OK.  DERM: No lesions noted; no rash etc...    Assessment & Plan:   HBP> Improved control on BBlocker, ACE, Diuretic as above; May need incr dosing but hard to tell w/ compliance issues; We discussed taking meds regularly every day & monitoring BP at home & work; She will call if BP >150/90 so we can increase meds...   CBruit> On ASA, no cerebral ischemic symptoms, continue Rx...   CHOL> On diet alone but LDL still sl up & needs better diet, incr exercise & esp to get  wt down...   Colon polyp> Stable w/o GI symptoms; Due for f/u of her tubulovillous adenoma 8/13...   DJD> States she's doing satis w/ OTC meds...   Anemia> Improved/ stable on Fe + MVI etc, additional w/u in progress.Marland KitchenMarland Kitchen

## 2011-06-18 ENCOUNTER — Encounter: Payer: Self-pay | Admitting: Pulmonary Disease

## 2011-11-29 ENCOUNTER — Ambulatory Visit (INDEPENDENT_AMBULATORY_CARE_PROVIDER_SITE_OTHER): Payer: BC Managed Care – PPO | Admitting: Pulmonary Disease

## 2011-11-29 ENCOUNTER — Ambulatory Visit (INDEPENDENT_AMBULATORY_CARE_PROVIDER_SITE_OTHER)
Admission: RE | Admit: 2011-11-29 | Discharge: 2011-11-29 | Disposition: A | Discharge status: Home / Self Care | Payer: BC Managed Care – PPO | Source: Ambulatory Visit | Attending: Pulmonary Disease | Admitting: Pulmonary Disease

## 2011-11-29 ENCOUNTER — Encounter: Payer: Self-pay | Admitting: Pulmonary Disease

## 2011-11-29 ENCOUNTER — Other Ambulatory Visit (INDEPENDENT_AMBULATORY_CARE_PROVIDER_SITE_OTHER): Payer: BC Managed Care – PPO

## 2011-11-29 VITALS — BP 158/82 | HR 70 | Temp 96.0°F | Ht 62.0 in | Wt 176.0 lb

## 2011-11-29 DIAGNOSIS — I10 Essential (primary) hypertension: Secondary | ICD-10-CM

## 2011-11-29 DIAGNOSIS — E559 Vitamin D deficiency, unspecified: Secondary | ICD-10-CM

## 2011-11-29 DIAGNOSIS — D649 Anemia, unspecified: Secondary | ICD-10-CM

## 2011-11-29 DIAGNOSIS — D126 Benign neoplasm of colon, unspecified: Secondary | ICD-10-CM

## 2011-11-29 DIAGNOSIS — M1990 Unspecified osteoarthritis, unspecified site: Secondary | ICD-10-CM

## 2011-11-29 DIAGNOSIS — F411 Generalized anxiety disorder: Secondary | ICD-10-CM

## 2011-11-29 DIAGNOSIS — R0989 Other specified symptoms and signs involving the circulatory and respiratory systems: Secondary | ICD-10-CM

## 2011-11-29 DIAGNOSIS — N200 Calculus of kidney: Secondary | ICD-10-CM

## 2011-11-29 DIAGNOSIS — E785 Hyperlipidemia, unspecified: Secondary | ICD-10-CM

## 2011-11-29 DIAGNOSIS — F419 Anxiety disorder, unspecified: Secondary | ICD-10-CM

## 2011-11-29 LAB — BASIC METABOLIC PANEL
CO2: 30 mEq/L (ref 19–32)
Calcium: 9.4 mg/dL (ref 8.4–10.5)
Creatinine, Ser: 1 mg/dL (ref 0.4–1.2)
GFR: 58.64 mL/min — ABNORMAL LOW (ref >60.00)
Glucose, Bld: 93 mg/dL (ref 70–99)

## 2011-11-29 LAB — CBC WITH DIFFERENTIAL/PLATELET
Eosinophils Relative: 3.6 % (ref 0.0–5.0)
HCT: 33.4 % — ABNORMAL LOW (ref 36.0–46.0)
Lymphocytes Relative: 35.1 % (ref 12.0–46.0)
Lymphs Abs: 1.7 10*3/uL (ref 0.7–4.0)
Monocytes Relative: 9.9 % (ref 3.0–12.0)
Platelets: 283 10*3/uL (ref 150.0–400.0)
WBC: 4.7 10*3/uL (ref 4.5–10.5)

## 2011-11-29 LAB — IBC PANEL
Iron: 56 ug/dL (ref 42–145)
Saturation Ratios: 20.5 % (ref 20.0–50.0)
Transferrin: 194.9 mg/dL — ABNORMAL LOW (ref 212.0–360.0)

## 2011-11-29 LAB — HEPATIC FUNCTION PANEL
Albumin: 3.6 g/dL (ref 3.5–5.2)
Total Protein: 7.9 g/dL (ref 6.0–8.3)

## 2011-11-29 LAB — LIPID PANEL
HDL: 40.2 mg/dL (ref >39.00)
Triglycerides: 89 mg/dL (ref 0.0–149.0)
VLDL: 17.8 mg/dL (ref 0.0–40.0)

## 2011-11-29 LAB — TSH: TSH: 1.38 u[IU]/mL (ref 0.35–5.50)

## 2011-11-29 MED ORDER — LISINOPRIL 20 MG PO TABS: 20.0000 mg | ORAL_TABLET | Freq: Every day | ORAL | Status: DC

## 2011-11-29 MED ORDER — HYDROCHLOROTHIAZIDE 12.5 MG PO CAPS: 12.5000 mg | ORAL_CAPSULE | ORAL | Status: DC

## 2011-11-29 MED ORDER — METOPROLOL SUCCINATE ER 50 MG PO TB24: 50.0000 mg | ORAL_TABLET | Freq: Every day | ORAL | Status: DC

## 2011-11-29 NOTE — Progress Notes (Signed)
Subjective:    Patient ID: Sara Rice, female    DOB: [birth year], 66 y.o.   MRN: [id number]  HPI 66 y/o BF here for a folllow up visit... she is Sara Rice sister & she works at the [employer]...   ~  May 25, 2010:  she reports a good 68mo- only c/o some intermittent right knee pain when she has to stand for prolonged periods (rec to try knee brace & Aleve Prn)... BP stable on meds;  weight down 3# on diet, but still needs incr exercise, etc... she hasn't contacted Sara Rice for GYN check up yet but promises to do this very soon...  ~  November 23, 2010: 68mo ROV and doing well w/o new complaints or concerns just sl tired from long hrs at work [employer]); Recently ran out of meds & we discussed taking meds regularly, all refilled today; She still hasn't set up GYN appt, mammograms etc "I've been so busy" & she promises to set this up soon (she declines our offer to set it up for her); Due for fasting blood work today> see below...   ~  May 31, 2011:  68mo ROV & she continues to do well- no new complaints or concerns, just some aches and pains for which she takes OTC meds w/ elief...     HBP> on MetoprololER50, Lisinopril20, HCT12.5; BP= 130/82 & feels well- denies CP palpit, dizzy, SOB, or edema...    Right Carotid Bruit> on ASA81; CDoppler 5/11 w/ 40-59% right ICA stenosis, and no cerebral ischemic symptoms...    Chol> on diet alone; last FLP 4/11 showed LDL 117; she is not fasting today...    GI> Polyps, Hems> colon 8/10 by Sara Rice w/ 15mm tubular adenoma removed.    Hx Kidney stone> no problem in many yrs, prev eval by Sara Rice...    DJD> she uses OTC analgesics as needed;     Vit D Defic> on Calcium, MVI, Vit D 1000u daily;     Anemia> on FeSO4 daily;   ~  November 29, 2011:  68mo ROV & Sara Rice is feeling her age w/ "aches & pains" she says- still gets adeq relief w/ OTC meds and no one particular problem area to refer to Ortho etc; she continues to work at the [employer] in [employer]  which makes it hard for her to lose wt- but she is down 2# to 176# today...    BP is sl elev today on her 3 meds (see below); reminded to elim sodium, take meds every day, & work on wt reduction...    She continues on ASA daily & is due for f/u CDoppler now, we will sched for her...    See prob list below>>  She is reminded to sched the needed GYN screening ASAP- PAP, Mammogram, BMD... CXR 4/13 showed borderline heart size & ectatic Ao; clear lungs w/ mild right diaph eventration, obese, degen spondylosis... EKG 4/13 showed NSR, rate64, NSSTTWA, NAD... LABS 4/13:  FLP- ok on diet alone;  Chems- wnl;  CBC- ok x Hg=11.4 Fe=56;  TSH=1.38   Problem List:    << PROBLEM LIST UPDATED 11/29/11 >>  HYPERTENSION (ICD-401.9) - on METOPROLOL ER 50mg /d + LISINOPRIL 20mg /d, & HCTZ 12.5mg  daily... states she checks BP at the pharm and they are all OK...  ~  10/11:  BP= 146/78 & reminded to take meds regularly... denies HA, fatigue, visual changes, CP, palipit, dizziness, syncope, dyspnea, edema, etc...  ~  4/12:  BP= 148/90  but as noted she had run out of her meds recently; still asymptomatic & denies CP, palpit, SOB, edema, etc... ~  10/12:  BP= 130/82 & she remains asymptomatic, continue same meds. ~  4/13:  BP= 158/82 & reminded to take meds daily, get on diet, get wt down...  CAROTID BRUIT (ICD-785.9) - on ASA 81mg /d... ~  CDoppler 5/11 showed mild smooth plaque on the right w/ 40-59% RICA stenosis & normal on left (f/u rec 70yrs) ~  F/u CDoppler due 5/13 & we will refer to get this scheduled...  HYPERCHOLESTEROLEMIA, BORDERLINE (ICD-272.4) - she is on a low cholesterol, low fat diet... ~  FLP 9/07 showed TChol 166, TG 51, HDL 34, LDL 121 ~  FLP 4/11 showed TChol 168, TG 66, HDL 38, LDL 117... discussed better diet, incr exercise. ~  FLP 4/12 showed TChol 179, TG 88, HDL 40, LDL 121 ~  FLP 4/13 showed TChol 162, TG 89, HDL 40,LDL 104  COLONIC POLYPS (ICD-211.3) & HEMORRHOIDS - she had colonoscopy 8/10  by Sara Rice w/ 15mm polyp at splenic flex= tubulovillous adenoma & f/u planned 37yrs... also had some hemorroids noted... ~  4/13:  She will be due for f/u colonoscopy per Sara Rice 8/13...  Hx of RENAL CALCULUS (ICD-592.0) - Rx by Sara Rice in the past...  OSTEOARTHRITIS (ICD-715.90) - she uses OTC Tylenol arthritis & we discussed knee brace & Aleve Prn. ~  4/13: c/o aches and pains but still doing OK on OTC analgesics...  VITAMIN D DEFICIENCY (ICD-268.9) - on Vit D 1000 u OTC daily. ~  Vit D level 4/10 = 26... started on OTC Vit D 1000 u daily. ~  labs 4/11 showed Vit D level = 45... continue OTC Rx. ~  4/13: she remains on Vit D supplement; asked to sched her GYN check ASAP to include BMD testing...  ANEMIA (ICD-285.9) - on SLOW-Fe daily... ~  labs 9/07 showed Hg= 11.1, MCV 87, Fe= 48 w/ Sat 16%... rec OTC Fe daily but she stopped on her own. ~  labs 4/10 showed Hg= 11.3, MCV= 86... rec> continue Fe supplement daily. ~  labs 4/11 showed Hg= 10.6, MCV= 87, Fe= 90 (30%)... rec> continue same. ~  Labs 4/12 showed Hg= 11.4, MCV=89, Fe=63 (20%sat), B12= 712, SPE= neg w/o monoclonal prot identified... ~  Labs 4/13 showed Hg= 11.4, MCV= 88, Fe= 56 (21%sat)  HEALTH MAINTENANCE:   ~  GI:  She is up to date on colon screening from Sara Rice (see above)... ~  She needs GYN doctor and Pap, Mammogram, Baseline BMD, etc... she has promised to get a gynecologist for these important tests... ~  Immuniz:  She gets the yearly flu vaccines...   No past surgical history on file.   Outpatient Encounter Prescriptions as of 11/29/2011  Medication Sig Dispense Refill  . aspirin 81 MG tablet Take 81 mg by mouth daily.        . cholecalciferol (VITAMIN D) 1000 UNITS tablet Take 1,000 Units by mouth daily.        . Ferrous Sulfate (SLOW FE) 142 (45 FE) MG TBCR Take by mouth daily.        . hydrochlorothiazide (,MICROZIDE/HYDRODIURIL,) 12.5 MG capsule Take 1 capsule (12.5 mg total) by mouth every morning.  30  capsule  11  . lisinopril (PRINIVIL,ZESTRIL) 20 MG tablet Take 1 tablet (20 mg total) by mouth daily.  30 tablet  11  . metoprolol (TOPROL-XL) 50 MG 24 hr tablet Take 1 tablet (50 mg total) by  mouth daily.  30 tablet  11    No Known Allergies   Current Medications, Allergies, Past Medical History, Past Surgical History, Family History, and Social History were reviewed in [facility] record.    Review of Systems        See HPI - all other systems neg except as noted...  The patient denies anorexia, fever, weight loss, weight gain, vision loss, decreased hearing, hoarseness, chest pain, syncope, dyspnea on exertion, peripheral edema, prolonged cough, headaches, hemoptysis, abdominal pain, melena, hematochezia, severe indigestion/heartburn, hematuria, incontinence, muscle weakness, suspicious skin lesions, transient blindness, difficulty walking, depression, unusual weight change, abnormal bleeding, enlarged lymph nodes, and angioedema.    Objective:   Physical Exam    WD, WN, 66 y/o BF in NAD...  GENERAL: Alert & oriented; pleasant & cooperative.  HEENT: [place]/AT, EOM-wnl, PERRLA, EACs-clear, TMs-wnl, NOSE-clear, THROAT-clear & wnl.  NECK: Supple w/ fairROM; no JVD; sl diminished carotid impulses w/ faint L bruit, no thyromegaly or nodules palpated; no lymphadenopathy.  CHEST: Clear to P & A; without wheezes/ rales/ or rhonchi.  HEART: Regular Rhythm; without murmurs/ rubs/ or gallops.  ABDOMEN: Soft & nontender; normal bowel sounds; no organomegaly or masses detected.  EXT: without deformities, mild arthritic changes; no varicose veins/ venous insuffic/ or edema.  NEURO: CN's intact; motor testing normal; sensory testing normal; gait normal & balance OK.  DERM: No lesions noted; no rash etc...   RADIOLOGY DATA:  Reviewed in the EPIC EMR & discussed w/ the patient...  LABORATORY DATA:  Reviewed in the EPIC EMR & discussed w/ the patient...   Assessment & Plan:     HBP> On BBlocker, ACE, Diuretic as above; May need incr dosing but hard to tell w/ compliance issues; We discussed taking meds regularly every day & monitoring BP at home & work; She will call if BP >150/90 so we can increase meds...   CBruit> On ASA, no cerebral ischemic symptoms, continue Rx; she is due for f/u CDoppler & we will sched...  CHOL> On diet alone but LDL still sl up & needs better diet, incr exercise & esp to get wt down...   Colon polyp> Stable w/o GI symptoms; Due for f/u of her tubulovillous adenoma 8/13...   DJD> States she's doing satis w/ OTC meds...   Anemia> Improved/ stable on Fe + MVI etc, additional w/u in progress...    Patient's Medications  New Prescriptions   No medications on file  Previous Medications   ASPIRIN 81 MG TABLET    Take 81 mg by mouth daily.     CHOLECALCIFEROL (VITAMIN D) 1000 UNITS TABLET    Take 1,000 Units by mouth daily.     FERROUS SULFATE (SLOW FE) 142 (45 FE) MG TBCR    Take by mouth daily.    Modified Medications   Modified Medication Previous Medication   HYDROCHLOROTHIAZIDE (MICROZIDE) 12.5 MG CAPSULE hydrochlorothiazide (,MICROZIDE/HYDRODIURIL,) 12.5 MG capsule      Take 1 capsule (12.5 mg total) by mouth every morning.    Take 1 capsule (12.5 mg total) by mouth every morning.   LISINOPRIL (PRINIVIL,ZESTRIL) 20 MG TABLET lisinopril (PRINIVIL,ZESTRIL) 20 MG tablet      Take 1 tablet (20 mg total) by mouth daily.    Take 1 tablet (20 mg total) by mouth daily.   METOPROLOL SUCCINATE (TOPROL-XL) 50 MG 24 HR TABLET metoprolol (TOPROL-XL) 50 MG 24 hr tablet      Take 1 tablet (50 mg total) by mouth daily.  Take 1 tablet (50 mg total) by mouth daily.  Discontinued Medications   No medications on file

## 2011-11-29 NOTE — Patient Instructions (Addendum)
Today we updated your med list in our EPIC system...    Continue your current medications the same...  Today we did your baseline CXR & EKG... We also did your follow up FASTING blood work...    We will call you w/ the results in a few days...  Let's get on track w/ our diet & exercise program...    Remember NO SALT, & restrict CARBS & FATS...    Work on losing 10-15 lbs over time so you will be able to KEEP it off...  We will arrange for a f/u [facility]otid [facility]ppler eval [facility]...  You should get a call from DrGessner's office early this summer regarding your follow up colonoscopy due later this yr...  Call for any questions...  Let's plan a follow up visit in 6 months to monitor your progress.Marland KitchenMarland Kitchen

## 2011-12-02 ENCOUNTER — Other Ambulatory Visit: Payer: Self-pay | Admitting: Cardiology

## 2011-12-02 DIAGNOSIS — R0989 Other specified symptoms and signs involving the circulatory and respiratory systems: Secondary | ICD-10-CM

## 2011-12-13 ENCOUNTER — Encounter (INDEPENDENT_AMBULATORY_CARE_PROVIDER_SITE_OTHER): Payer: BC Managed Care – PPO

## 2011-12-13 DIAGNOSIS — I6529 Occlusion and stenosis of unspecified carotid artery: Secondary | ICD-10-CM

## 2011-12-13 DIAGNOSIS — R0989 Other specified symptoms and signs involving the circulatory and respiratory systems: Secondary | ICD-10-CM

## 2012-01-31 ENCOUNTER — Encounter: Payer: Self-pay | Admitting: Internal Medicine

## 2012-05-24 ENCOUNTER — Encounter: Payer: Self-pay | Admitting: *Deleted

## 2012-05-29 ENCOUNTER — Encounter: Payer: Self-pay | Admitting: Pulmonary Disease

## 2012-05-29 ENCOUNTER — Ambulatory Visit (INDEPENDENT_AMBULATORY_CARE_PROVIDER_SITE_OTHER): Payer: BC Managed Care – PPO | Admitting: Pulmonary Disease

## 2012-05-29 VITALS — BP 138/86 | HR 71 | Temp 98.2°F | Ht 62.0 in | Wt 177.0 lb

## 2012-05-29 DIAGNOSIS — I10 Essential (primary) hypertension: Secondary | ICD-10-CM

## 2012-05-29 DIAGNOSIS — N200 Calculus of kidney: Secondary | ICD-10-CM

## 2012-05-29 DIAGNOSIS — D126 Benign neoplasm of colon, unspecified: Secondary | ICD-10-CM

## 2012-05-29 DIAGNOSIS — D649 Anemia, unspecified: Secondary | ICD-10-CM

## 2012-05-29 DIAGNOSIS — M1990 Unspecified osteoarthritis, unspecified site: Secondary | ICD-10-CM

## 2012-05-29 DIAGNOSIS — R0989 Other specified symptoms and signs involving the circulatory and respiratory systems: Secondary | ICD-10-CM

## 2012-05-29 DIAGNOSIS — E559 Vitamin D deficiency, unspecified: Secondary | ICD-10-CM

## 2012-05-29 DIAGNOSIS — E785 Hyperlipidemia, unspecified: Secondary | ICD-10-CM

## 2012-05-29 MED ORDER — LISINOPRIL 20 MG PO TABS: 20.0000 mg | ORAL_TABLET | Freq: Every day | ORAL | Status: DC

## 2012-05-29 MED ORDER — METOPROLOL SUCCINATE ER 50 MG PO TB24: 50.0000 mg | ORAL_TABLET | Freq: Every day | ORAL | Status: DC

## 2012-05-29 MED ORDER — HYDROCHLOROTHIAZIDE 12.5 MG PO CAPS: 12.5000 mg | ORAL_CAPSULE | ORAL | Status: DC

## 2012-05-29 NOTE — Progress Notes (Signed)
Subjective:    Patient ID: Sara Rice, female    DOB: 12-Jan-1946, 66 y.o.   MRN: [id number]  HPI 66 y/o BF here for a folllow up visit... she is [name] sister & she works at the [employer]...   ~  May 25, 2010:  she reports a good 658mo- only c/o some intermittent right knee pain when she has to stand for prolonged periods (rec to try knee brace & Aleve Prn)... BP stable on meds;  weight down 3# on diet, but still needs incr exercise, etc... she hasn't contacted DrNeal for GYN check up yet but promises to do this very soon...  ~  November 23, 2010: 658mo ROV and doing well w/o new complaints or concerns just sl tired from long hrs at work [employer]); Recently ran out of meds & we discussed taking meds regularly, all refilled today; She still hasn't set up GYN appt, mammograms etc "I've been so busy" & she promises to set this up soon (she declines our offer to set it up for her); Due for fasting blood work today> see below...   ~  May 31, 2011:  658mo ROV & she continues to do well- no new complaints or concerns, just some aches and pains for which she takes OTC meds w/ elief...     HBP> on MetoprololER50, Lisinopril20, HCT12.5; BP= 130/82 & feels well- denies CP palpit, dizzy, SOB, or edema...    Right Carotid Bruit> on ASA81; CDoppler 5/11 w/ 40-59% right ICA stenosis, and no cerebral ischemic symptoms...    Chol> on diet alone; last FLP 4/11 showed LDL 117; she is not fasting today...    GI> Polyps, Hems> colon 8/10 by DrGessner w/ 15mm tubular adenoma removed.    Hx Kidney stone> no problem in many yrs, prev eval by DrNesi...    DJD> she uses OTC analgesics as needed;     Vit D Defic> on Calcium, MVI, Vit D 1000u daily;     Anemia> on FeSO4 daily;   ~  November 29, 2011:  658mo ROV & Kamariyah is feeling her age w/ "aches & pains" she says- still gets adeq relief w/ OTC meds and no one particular problem area to refer to Ortho etc; she continues to work at the [employer] in [employer]  which makes it hard for her to lose wt- but she is down 2# to 176# today...    BP is sl elev today on her 3 meds (see below); reminded to elim sodium, take meds every day, & work on wt reduction...    She continues on ASA daily & is due for f/u CDoppler now, we will sched for her...    See prob list below>>  She is reminded to sched the needed GYN screening ASAP- PAP, Mammogram, BMD... CXR 4/13 showed borderline heart size & ectatic Ao; clear lungs w/ mild right diaph eventration, obese, degen spondylosis... EKG 4/13 showed NSR, rate64, NSSTTWA, NAD... LABS 4/13:  FLP- ok on diet alone;  Chems- wnl;  CBC- ok x Hg=11.4 Fe=56;  TSH=1.38  ~  May 29, 2012:  658mo ROV & Belicia reports a good interval w/o new complaints or concerns;  BP is well controlled on her regimen & measures 138/86 today, similar at home she says & denies CP, palpit, SOB, edema, etc; she is exercising by walking & notes that her breathing is good; she is not really on a diet 7 we reviewed low sodium, low carb, wt  reducing diet recs... We decided to do her Fasting blood work on return...    We reviewed prob list, meds, xrays and labs> see below for updates >> OK 2013 Flu vaccine today & refill prescriptions...         Problem List:      HYPERTENSION (ICD-401.9) - on METOPROLOL ER 50mg /d + LISINOPRIL 20mg /d, & HCTZ 12.5mg  daily... states she checks BP at the pharm and they are all OK...  ~  10/11:  BP= 146/78 & reminded to take meds regularly... denies HA, fatigue, visual changes, CP, palipit, dizziness, syncope, dyspnea, edema, etc...  ~  4/12:  BP= 148/90 but as noted she had run out of her meds recently; still asymptomatic & denies CP, palpit, SOB, edema, etc... ~  10/12:  BP= 130/82 & she remains asymptomatic, continue same meds. ~  4/13:  BP= 158/82 & reminded to take meds daily, get on diet, get wt down... ~  EKG 4/13 showed NSR, rate64, NSSTTWA, NAD.Marland Kitchen. ~  CXR 5/13 showed borderline heart size, aortic ectasia, clear  lungs, degen spondylosis in spine, NAD... ~  10/13:  BP= 138/86 7 she remains asymptomatic...  CAROTID BRUIT (ICD-785.9) - on ASA 81mg /d... ~  CDoppler 5/11 showed mild smooth plaque on the right w/ 40-59% RICA stenosis & normal on left (f/u rec 37yrs) ~  F/u CDopplers 5/13 showed similar changes w/ 40-59% RICA stenosis & normal LICA flow...  HYPERCHOLESTEROLEMIA, BORDERLINE (ICD-272.4) - she is on a low cholesterol, low fat diet... ~  FLP 9/07 showed TChol 166, TG 51, HDL 34, LDL 121 ~  FLP 4/11 showed TChol 168, TG 66, HDL 38, LDL 117... discussed better diet, incr exercise. ~  FLP 4/12 showed TChol 179, TG 88, HDL 40, LDL 121 ~  FLP 4/13 showed TChol 162, TG 89, HDL 40,LDL 104  COLONIC POLYPS (ICD-211.3) & HEMORRHOIDS - she had colonoscopy 8/10 by DrGessner w/ 15mm polyp at splenic flex= tubulovillous adenoma & f/u planned 74yrs... also had some hemorroids noted... ~  4/13:  She will be due for f/u colonoscopy per DrGessner 8/13...  Hx of RENAL CALCULUS (ICD-592.0) - Rx by DrNesi in the past...  OSTEOARTHRITIS (ICD-715.90) - she uses OTC Tylenol arthritis & we discussed knee brace & Aleve Prn. ~  4/13: c/o aches and pains but still doing OK on OTC analgesics...  VITAMIN D DEFICIENCY (ICD-268.9) - on Vit D 1000 u OTC daily. ~  Vit D level 4/10 = 26... started on OTC Vit D 1000 u daily. ~  labs 4/11 showed Vit D level = 45... continue OTC Rx. ~  4/13: she remains on Vit D supplement; asked to sched her GYN check ASAP to include BMD testing...  ANEMIA (ICD-285.9) - on SLOW-Fe daily... ~  labs 9/07 showed Hg= 11.1, MCV 87, Fe= 48 w/ Sat 16%... rec OTC Fe daily but she stopped on her own. ~  labs 4/10 showed Hg= 11.3, MCV= 86... rec> continue Fe supplement daily. ~  labs 4/11 showed Hg= 10.6, MCV= 87, Fe= 90 (30%)... rec> continue same. ~  Labs 4/12 showed Hg= 11.4, MCV=89, Fe=63 (20%sat), B12= 712, SPE= neg w/o monoclonal prot identified... ~  Labs 4/13 showed Hg= 11.4, MCV= 88, Fe= 56  (21%sat)  HEALTH MAINTENANCE:   ~  GI:  She is up to date on colon screening from DrGessner (see above)... ~  She needs GYN doctor and Pap, Mammogram, Baseline BMD, etc... she has promised to get a gynecologist for these important tests... ~  Immuniz:  She gets the yearly flu vaccines...   No past surgical history on file.   Outpatient Encounter Prescriptions as of 05/29/2012  Medication Sig Dispense Refill  . aspirin 81 MG tablet Take 81 mg by mouth daily.        . cholecalciferol (VITAMIN D) 1000 UNITS tablet Take 1,000 Units by mouth daily.        . Ferrous Sulfate (SLOW FE) 142 (45 FE) MG TBCR Take by mouth daily.        . hydrochlorothiazide (MICROZIDE) 12.5 MG capsule Take 1 capsule (12.5 mg total) by mouth every morning.  30 capsule  11  . lisinopril (PRINIVIL,ZESTRIL) 20 MG tablet Take 1 tablet (20 mg total) by mouth daily.  30 tablet  11  . metoprolol succinate (TOPROL-XL) 50 MG 24 hr tablet Take 1 tablet (50 mg total) by mouth daily.  30 tablet  11    No Known Allergies   Current Medications, Allergies, Past Medical History, Past Surgical History, Family History, and Social History were reviewed in [employer] [facility] record.    Review of Systems        See HPI - all other systems neg except as noted...  The patient denies anorexia, fever, weight loss, weight gain, vision loss, decreased hearing, hoarseness, chest pain, syncope, dyspnea on exertion, peripheral edema, prolonged cough, headaches, hemoptysis, abdominal pain, melena, hematochezia, severe indigestion/heartburn, hematuria, incontinence, muscle weakness, suspicious skin lesions, transient blindness, difficulty walking, depression, unusual weight change, abnormal bleeding, enlarged lymph nodes, and angioedema.    Objective:   Physical Exam    WD, WN, 66 y/o BF in NAD...  GENERAL: Alert & oriented; pleasant & cooperative.  HEENT: [place]/AT, EOM-wnl, PERRLA, EACs-clear, TMs-wnl, NOSE-clear,  THROAT-clear & wnl.  NECK: Supple w/ fairROM; no JVD; sl diminished carotid impulses w/ faint L bruit, no thyromegaly or nodules palpated; no lymphadenopathy.  CHEST: Clear to P & A; without wheezes/ rales/ or rhonchi.  HEART: Regular Rhythm; without murmurs/ rubs/ or gallops.  ABDOMEN: Soft & nontender; normal bowel sounds; no organomegaly or masses detected.  EXT: without deformities, mild arthritic changes; no varicose veins/ venous insuffic/ or edema.  NEURO: CN's intact; motor testing normal; sensory testing normal; gait normal & balance OK.  DERM: No lesions noted; no rash etc...   RADIOLOGY DATA:  Reviewed in the EPIC EMR & discussed w/ the patient...  LABORATORY DATA:  Reviewed in the EPIC EMR & discussed w/ the patient...   Assessment & Plan:    HBP> On BBlocker, ACE, Diuretic as above; May need incr dosing but hard to tell w/ compliance issues; We discussed taking meds regularly every day & monitoring BP at home & work; She will call if BP >150/90 so we can increase meds...   CBruit> On ASA, no cerebral ischemic symptoms, continue Rx; she is due for f/u CDoppler & we will sched...  CHOL> On diet alone but LDL still sl up & needs better diet, incr exercise & esp to get wt down...   Colon polyp> Stable w/o GI symptoms; Due for f/u of her tubulovillous adenoma 8/13...   DJD> States she's doing satis w/ OTC meds...   Anemia> Improved/ stable on Fe + MVI etc, additional w/u in progress...    Patient's Medications  New Prescriptions   No medications on file  Previous Medications   ASPIRIN 81 MG TABLET    Take 81 mg by mouth daily.     CHOLECALCIFEROL (VITAMIN D) 1000 UNITS TABLET  Take 1,000 Units by mouth daily.     FERROUS SULFATE (SLOW FE) 142 (45 FE) MG TBCR    Take by mouth daily.    Modified Medications   Modified Medication Previous Medication   HYDROCHLOROTHIAZIDE (MICROZIDE) 12.5 MG CAPSULE hydrochlorothiazide (MICROZIDE) 12.5 MG capsule      Take 1 capsule  (12.5 mg total) by mouth every morning.    Take 1 capsule (12.5 mg total) by mouth every morning.   LISINOPRIL (PRINIVIL,ZESTRIL) 20 MG TABLET lisinopril (PRINIVIL,ZESTRIL) 20 MG tablet      Take 1 tablet (20 mg total) by mouth daily.    Take 1 tablet (20 mg total) by mouth daily.   METOPROLOL SUCCINATE (TOPROL-XL) 50 MG 24 HR TABLET metoprolol succinate (TOPROL-XL) 50 MG 24 hr tablet      Take 1 tablet (50 mg total) by mouth daily.    Take 1 tablet (50 mg total) by mouth daily.  Discontinued Medications   No medications on file

## 2012-05-29 NOTE — Patient Instructions (Addendum)
Today we updated your med list in our EPIC system...    Continue your current medications the same...    We refilled your meds per request...  Today we gave you the 2013 Flu vacine...  Call for any questions...  Let's plan a f/u visit in 6 months.Marland KitchenMarland Kitchen

## 2012-11-09 ENCOUNTER — Encounter: Payer: Self-pay | Admitting: Internal Medicine

## 2012-12-04 ENCOUNTER — Other Ambulatory Visit (INDEPENDENT_AMBULATORY_CARE_PROVIDER_SITE_OTHER): Payer: BC Managed Care – PPO

## 2012-12-04 ENCOUNTER — Encounter: Payer: Self-pay | Admitting: Pulmonary Disease

## 2012-12-04 ENCOUNTER — Ambulatory Visit (INDEPENDENT_AMBULATORY_CARE_PROVIDER_SITE_OTHER): Payer: BC Managed Care – PPO | Admitting: Pulmonary Disease

## 2012-12-04 VITALS — BP 130/76 | HR 62 | Temp 97.1°F | Ht 62.0 in | Wt 174.6 lb

## 2012-12-04 DIAGNOSIS — D126 Benign neoplasm of colon, unspecified: Secondary | ICD-10-CM

## 2012-12-04 DIAGNOSIS — D649 Anemia, unspecified: Secondary | ICD-10-CM

## 2012-12-04 DIAGNOSIS — E785 Hyperlipidemia, unspecified: Secondary | ICD-10-CM

## 2012-12-04 DIAGNOSIS — I10 Essential (primary) hypertension: Secondary | ICD-10-CM

## 2012-12-04 DIAGNOSIS — E559 Vitamin D deficiency, unspecified: Secondary | ICD-10-CM

## 2012-12-04 DIAGNOSIS — F411 Generalized anxiety disorder: Secondary | ICD-10-CM

## 2012-12-04 DIAGNOSIS — M1990 Unspecified osteoarthritis, unspecified site: Secondary | ICD-10-CM

## 2012-12-04 DIAGNOSIS — R0989 Other specified symptoms and signs involving the circulatory and respiratory systems: Secondary | ICD-10-CM

## 2012-12-04 LAB — CBC WITH DIFFERENTIAL/PLATELET
Basophils Absolute: 0 10*3/uL (ref 0.0–0.1)
Eosinophils Absolute: 0.2 10*3/uL (ref 0.0–0.7)
HCT: 34 % — ABNORMAL LOW (ref 36.0–46.0)
Hemoglobin: 11.6 g/dL — ABNORMAL LOW (ref 12.0–15.0)
Lymphocytes Relative: 39.1 % (ref 12.0–46.0)
Lymphs Abs: 2 10*3/uL (ref 0.7–4.0)
MCHC: 34 g/dL (ref 30.0–36.0)
Monocytes Relative: 10.8 % (ref 3.0–12.0)
Neutro Abs: 2.4 10*3/uL (ref 1.4–7.7)
Platelets: 315 10*3/uL (ref 150.0–400.0)
RDW: 13.8 % (ref 11.5–14.6)

## 2012-12-04 MED ORDER — HYDROCHLOROTHIAZIDE 12.5 MG PO CAPS: 12.5000 mg | ORAL_CAPSULE | ORAL | Status: DC

## 2012-12-04 MED ORDER — METOPROLOL SUCCINATE ER 50 MG PO TB24: 50.0000 mg | ORAL_TABLET | Freq: Every day | ORAL | Status: DC

## 2012-12-04 MED ORDER — LISINOPRIL 20 MG PO TABS: 20.0000 mg | ORAL_TABLET | Freq: Every day | ORAL | Status: DC

## 2012-12-04 MED ORDER — TRAMADOL HCL 50 MG PO TABS: 50.0000 mg | ORAL_TABLET | Freq: Three times a day (TID) | ORAL | Status: DC | PRN

## 2012-12-04 NOTE — Progress Notes (Signed)
Subjective:    Patient ID: Sara Rice, female    DOB: [birth year], 67 y.o.   MRN: [id number]  HPI 67 y/o BF here for a folllow up visit... she is [name] sister & she works at the [employer]...   ~  May 31, 2011:  85mo ROV & she continues to do well- no new complaints or concerns, just some aches and pains for which she takes OTC meds w/ elief...     HBP> on MetoprololER50, Lisinopril20, HCT12.5; BP= 130/82 & feels well- denies CP palpit, dizzy, SOB, or edema...    Right Carotid Bruit> on ASA81; CDoppler 5/11 w/ 40-59% right ICA stenosis, and no cerebral ischemic symptoms...    Chol> on diet alone; last FLP 4/11 showed LDL 117; she is not fasting today...    GI> Polyps, Hems> colon 8/10 by DrGessner w/ 15mm tubular adenoma removed.    Hx Kidney stone> no problem in many yrs, prev eval by DrNesi...    DJD> she uses OTC analgesics as needed;     Vit D Defic> on Calcium, MVI, Vit D 1000u daily;     Anemia> on FeSO4 daily;   ~  November 29, 2011:  85mo ROV & Sara Rice is feeling her age w/ "aches & pains" she says- still gets adeq relief w/ OTC meds and no one particular problem area to refer to Ortho etc; she continues to work at the [employer] in [employer] which makes it hard for her to lose wt- but she is down 2# to 176# today...    BP is sl elev today on her 3 meds (see below); reminded to elim sodium, take meds every day, & work on wt reduction...    She continues on ASA daily & is due for f/u CDoppler now, we will sched for her...    See prob list below>>  She is reminded to sched the needed GYN screening ASAP- PAP, Mammogram, BMD... CXR 4/13 showed borderline heart size & ectatic Ao; clear lungs w/ mild right diaph eventration, obese, degen spondylosis... EKG 4/13 showed NSR, rate64, NSSTTWA, NAD... LABS 4/13:  FLP- ok on diet alone;  Chems- wnl;  CBC- ok x Hg=11.4 Fe=56;  TSH=1.38  ~  May 29, 2012:  85mo ROV & Sara Rice reports a good interval w/o new complaints or concerns;  BP is  well controlled on her regimen & measures 138/86 today, similar at home she says & denies CP, palpit, SOB, edema, etc; she is exercising by walking & notes that her breathing is good; she is not really on a diet 7 we reviewed low sodium, low carb, wt reducing diet recs... We decided to do her Fasting blood work on return...    We reviewed prob list, meds, xrays and labs> see below for updates >> OK 2013 Flu vaccine today & refill prescriptions...  ~  Dec 04, 2012:  85mo ROV & Sara Rice is c/o arthritis pain through the winter esp in her legs from the knees down "esp when I walk"; offered XRays and Ortho eval but she says it's not that bad yet & declines further eval; we discussed Rx w/ Tramadol for prn use... We reviewed the following medical problems during today's office visit >>     HBP> on MetoprololER50, Lisinopril20, HCT12.5; BP= 130/76 & feels well- denies CP palpit, dizzy, SOB, or edema...    Right Carotid Bruit> on ASA81; CDoppler 5/13 w/ stable 40-59% right ICA stenosis, and no cerebral ischemic symptoms, f/u planned  44yrs...    Chol> on diet alone; FLP 5/14 shows TChol 119, tg 94, HDL 32, LDL 118    GI> Polyps, Hems> colon 8/10 by DrGessner w/ 15mm tubular adenoma removed; he recommended 51yr f/u & she is overdue- refer to GI...    Hx Kidney stone> no problem in many yrs, prev eval by DrNesi...    DJD> she uses OTC analgesics as needed; c/o incr pain in legs from the knee down but she declines XRay or Ortho referral; try Tramadol50mg  prn use...    Vit D Defic> on Calcium, MVI, Vit D 1000u daily; she has not yet chosen a Gyn for f/u Pap, Mammograms, & BMD- encouraged to do so & offered to set this up for her but she declines...    Anemia> on OTC FeSO4 daily (only taking 65mg  tab); labs 5/14 showed Hg= 11.6 We reviewed prob list, meds, xrays and labs> see below for updates >>  LABS 5/14:  FLP- at goals on diet alone;  CBC- ok x Hg=11.6;  Fe=71 (25%sat);  TSH=1.28...          Problem List:       HYPERTENSION (ICD-401.9) - on METOPROLOL ER 50mg /d + LISINOPRIL 20mg /d, & HCTZ 12.5mg  daily... states she checks BP at the pharm and they are all OK...  ~  10/11:  BP= 146/78 & reminded to take meds regularly... denies HA, fatigue, visual changes, CP, palipit, dizziness, syncope, dyspnea, edema, etc...  ~  4/12:  BP= 148/90 but as noted she had run out of her meds recently; still asymptomatic & denies CP, palpit, SOB, edema, etc... ~  10/12:  BP= 130/82 & she remains asymptomatic, continue same meds. ~  4/13:  BP= 158/82 & reminded to take meds daily, get on diet, get wt down... ~  EKG 4/13 showed NSR, rate64, NSSTTWA, NAD.Marland Kitchen. ~  CXR 5/13 showed borderline heart size, aortic ectasia, clear lungs, degen spondylosis in spine, NAD... ~  10/13:  BP= 138/86 & she remains asymptomatic... ~  5/14:  on MetoprololER50, Lisinopril20, HCT12.5; BP= 130/76 & feels well- denies CP palpit, dizzy, SOB, or edema.  CAROTID BRUIT (ICD-785.9) - on ASA 81mg /d... ~  CDoppler 5/11 showed mild smooth plaque on the right w/ 40-59% RICA stenosis & normal on left (f/u rec 103yrs) ~  F/u CDopplers 5/13 showed similar changes w/ 40-59% RICA stenosis & normal LICA flow (they rec f/u 44yrs)...  HYPERCHOLESTEROLEMIA, BORDERLINE (ICD-272.4) - she is on a low cholesterol, low fat diet... ~  FLP 9/07 showed TChol 166, TG 51, HDL 34, LDL 121 ~  FLP 4/11 showed TChol 168, TG 66, HDL 38, LDL 117... discussed better diet, incr exercise. ~  FLP 4/12 showed TChol 179, TG 88, HDL 40, LDL 121 ~  FLP 4/13 showed TChol 162, TG 89, HDL 40,LDL 104 ~  FLP 5/14 on diet alone showed TChol 119, tg 94, HDL 32, LDL 118   COLONIC POLYPS (OZH-086.5) & HEMORRHOIDS - she had colonoscopy 8/10 by DrGessner w/ 15mm polyp at splenic flex= tubulovillous adenoma & f/u planned 48yrs... also had some hemorroids noted... ~  4/13:  She will be due for f/u colonoscopy per DrGessner 8/13...  Hx of RENAL CALCULUS (ICD-592.0) - Rx by DrNesi in the  past...  OSTEOARTHRITIS (ICD-715.90) - she uses OTC Tylenol arthritis & we discussed knee brace & Aleve Prn. ~  4/13: c/o aches and pains but still doing OK on OTC analgesics...  VITAMIN D DEFICIENCY (ICD-268.9) - on Vit D 1000 u  OTC daily. ~  Vit D level 4/10 = 26... started on OTC Vit D 1000 u daily. ~  labs 4/11 showed Vit D level = 45... continue OTC Rx. ~  4/13: she remains on Vit D supplement; asked to sched her GYN check ASAP to include BMD testing...  ANEMIA (ICD-285.9) - on SLOW-Fe daily... ~  labs 9/07 showed Hg= 11.1, MCV 87, Fe= 48 w/ Sat 16%... rec OTC Fe daily but she stopped on her own. ~  labs 4/10 showed Hg= 11.3, MCV= 86... rec> continue Fe supplement daily. ~  labs 4/11 showed Hg= 10.6, MCV= 87, Fe= 90 (30%)... rec> continue same. ~  Labs 4/12 showed Hg= 11.4, MCV=89, Fe=63 (20%sat), B12= 712, SPE= neg w/o monoclonal prot identified... ~  Labs 4/13 showed Hg= 11.4, MCV= 88, Fe= 56 (21%sat) ~  Labs 5/14 showed Hg= 11.6, MCV= 88, Fe= 71 (25%sat)  HEALTH MAINTENANCE:   ~  GI:  She is up to date on colon screening from DrGessner (see above)... ~  She needs GYN doctor and Pap, Mammogram, Baseline BMD, etc... she has promised to get a gynecologist for these important tests... ~  Immuniz:  She gets the yearly flu vaccines...   History reviewed. No pertinent past surgical history.   Outpatient Encounter Prescriptions as of 12/04/2012  Medication Sig Dispense Refill  . aspirin 81 MG tablet Take 81 mg by mouth daily.        . cholecalciferol (VITAMIN D) 1000 UNITS tablet Take 1,000 Units by mouth daily.        . Ferrous Sulfate (IRON) 325 (65 FE) MG TABS Take 1 tablet by mouth daily.      . hydrochlorothiazide (MICROZIDE) 12.5 MG capsule Take 1 capsule (12.5 mg total) by mouth every morning.  30 capsule  11  . lisinopril (PRINIVIL,ZESTRIL) 20 MG tablet Take 1 tablet (20 mg total) by mouth daily.  30 tablet  11  . metoprolol succinate (TOPROL-XL) 50 MG 24 hr tablet Take 1  tablet (50 mg total) by mouth daily.  30 tablet  11  . [DISCONTINUED] Ferrous Sulfate (SLOW FE) 142 (45 FE) MG TBCR Take by mouth daily.         No facility-administered encounter medications on file as of 12/04/2012.    No Known Allergies   Current Medications, Allergies, Past Medical History, Past Surgical History, Family History, and Social History were reviewed in [employer] [facility] record.    Review of Systems        See HPI - all other systems neg except as noted...  The patient denies anorexia, fever, weight loss, weight gain, vision loss, decreased hearing, hoarseness, chest pain, syncope, dyspnea on exertion, peripheral edema, prolonged cough, headaches, hemoptysis, abdominal pain, melena, hematochezia, severe indigestion/heartburn, hematuria, incontinence, muscle weakness, suspicious skin lesions, transient blindness, difficulty walking, depression, unusual weight change, abnormal bleeding, enlarged lymph nodes, and angioedema.    Objective:   Physical Exam    WD, WN, 67 y/o BF in NAD...  GENERAL: Alert & oriented; pleasant & cooperative.  HEENT: [place]/AT, EOM-wnl, PERRLA, EACs-clear, TMs-wnl, NOSE-clear, THROAT-clear & wnl.  NECK: Supple w/ fairROM; no JVD; sl diminished carotid impulses w/ faint L bruit, no thyromegaly or nodules palpated; no lymphadenopathy.  CHEST: Clear to P & A; without wheezes/ rales/ or rhonchi.  HEART: Regular Rhythm; without murmurs/ rubs/ or gallops.  ABDOMEN: Soft & nontender; normal bowel sounds; no organomegaly or masses detected.  EXT: without deformities, mild arthritic changes; no varicose veins/ venous  insuffic/ or edema.  NEURO: CN's intact; motor testing normal; sensory testing normal; gait normal & balance OK.  DERM: No lesions noted; no rash etc...   RADIOLOGY DATA:  Reviewed in the EPIC EMR & discussed w/ the patient...  LABORATORY DATA:  Reviewed in the EPIC EMR & discussed w/ the patient...   Assessment & Plan:     HBP> On BBlocker, ACE, Diuretic as above; May need incr dosing but hard to tell w/ compliance issues; We discussed taking meds regularly every day & monitoring BP at home & work; She will call if BP >150/90 so we can increase meds...   CBruit> On ASA, no cerebral ischemic symptoms, continue Rx; she is due for f/u CDoppler & we will sched...  CHOL> On diet alone but LDL still sl up & needs better diet, incr exercise & esp to get wt down...   Colon polyp> Stable w/o GI symptoms; Due for f/u of her tubulovillous adenoma 8/13...   DJD> States she's doing satis w/ OTC meds...   Anemia> Improved/ stable on Fe + MVI etc, additional w/u in progress...    Patient's Medications  New Prescriptions   TRAMADOL (ULTRAM) 50 MG TABLET    Take 1 tablet (50 mg total) by mouth 3 (three) times daily as needed for pain.  Previous Medications   ASPIRIN 81 MG TABLET    Take 81 mg by mouth daily.     CHOLECALCIFEROL (VITAMIN D) 1000 UNITS TABLET    Take 1,000 Units by mouth daily.     FERROUS SULFATE (IRON) 325 (65 FE) MG TABS    Take 1 tablet by mouth daily.  Modified Medications   Modified Medication Previous Medication   HYDROCHLOROTHIAZIDE (MICROZIDE) 12.5 MG CAPSULE hydrochlorothiazide (MICROZIDE) 12.5 MG capsule      Take 1 capsule (12.5 mg total) by mouth every morning.    Take 1 capsule (12.5 mg total) by mouth every morning.   LISINOPRIL (PRINIVIL,ZESTRIL) 20 MG TABLET lisinopril (PRINIVIL,ZESTRIL) 20 MG tablet      Take 1 tablet (20 mg total) by mouth daily.    Take 1 tablet (20 mg total) by mouth daily.   METOPROLOL SUCCINATE (TOPROL-XL) 50 MG 24 HR TABLET metoprolol succinate (TOPROL-XL) 50 MG 24 hr tablet      Take 1 tablet (50 mg total) by mouth daily.    Take 1 tablet (50 mg total) by mouth daily.  Discontinued Medications   FERROUS SULFATE (SLOW FE) 142 (45 FE) MG TBCR    Take by mouth daily.

## 2012-12-04 NOTE — Patient Instructions (Addendum)
Today we updated your med list in our EPIC system...    Continue your current medications the same...    We refilled your meds per request...  Today we wrote a new prescription for TRAMADOL 50mg - try one tab up to 3 times daily as needed for pain...  Today we did your follow up FASTING blood work...    We will contact you w/ the results when available...   Say as active as possible...  Call for any questions...  Let's plan a follow up visit in 1mo, sooner if needed for problems.Marland KitchenMarland Kitchen

## 2012-12-05 LAB — TSH: TSH: 1.28 u[IU]/mL (ref 0.35–5.50)

## 2012-12-05 LAB — LIPID PANEL
Cholesterol: 169 mg/dL (ref 0–200)
HDL: 32.3 mg/dL — ABNORMAL LOW (ref >39.00)
Triglycerides: 94 mg/dL (ref 0.0–149.0)

## 2012-12-05 LAB — BASIC METABOLIC PANEL
CO2: 26 mEq/L (ref 19–32)
Calcium: 9.4 mg/dL (ref 8.4–10.5)
Glucose, Bld: 79 mg/dL (ref 70–99)
Sodium: 141 mEq/L (ref 135–145)

## 2012-12-05 LAB — HEPATIC FUNCTION PANEL
Albumin: 3.7 g/dL (ref 3.5–5.2)
Alkaline Phosphatase: 80 U/L (ref 39–117)
Total Protein: 8 g/dL (ref 6.0–8.3)

## 2013-03-19 ENCOUNTER — Other Ambulatory Visit: Payer: Self-pay | Admitting: Pulmonary Disease

## 2013-03-19 DIAGNOSIS — D126 Benign neoplasm of colon, unspecified: Secondary | ICD-10-CM

## 2013-04-26 ENCOUNTER — Encounter: Payer: Self-pay | Admitting: Gastroenterology

## 2013-06-18 ENCOUNTER — Encounter: Payer: Self-pay | Admitting: Pulmonary Disease

## 2013-06-18 ENCOUNTER — Other Ambulatory Visit (INDEPENDENT_AMBULATORY_CARE_PROVIDER_SITE_OTHER): Payer: BC Managed Care – PPO

## 2013-06-18 ENCOUNTER — Encounter (INDEPENDENT_AMBULATORY_CARE_PROVIDER_SITE_OTHER): Payer: Self-pay

## 2013-06-18 ENCOUNTER — Ambulatory Visit (INDEPENDENT_AMBULATORY_CARE_PROVIDER_SITE_OTHER): Payer: BC Managed Care – PPO | Admitting: Pulmonary Disease

## 2013-06-18 VITALS — BP 142/74 | HR 66 | Temp 97.3°F | Ht 62.0 in | Wt 177.2 lb

## 2013-06-18 DIAGNOSIS — Z23 Encounter for immunization: Secondary | ICD-10-CM

## 2013-06-18 DIAGNOSIS — D649 Anemia, unspecified: Secondary | ICD-10-CM

## 2013-06-18 DIAGNOSIS — N200 Calculus of kidney: Secondary | ICD-10-CM

## 2013-06-18 DIAGNOSIS — I6529 Occlusion and stenosis of unspecified carotid artery: Secondary | ICD-10-CM | POA: Insufficient documentation

## 2013-06-18 DIAGNOSIS — E559 Vitamin D deficiency, unspecified: Secondary | ICD-10-CM

## 2013-06-18 DIAGNOSIS — M1990 Unspecified osteoarthritis, unspecified site: Secondary | ICD-10-CM

## 2013-06-18 DIAGNOSIS — I10 Essential (primary) hypertension: Secondary | ICD-10-CM

## 2013-06-18 DIAGNOSIS — K649 Unspecified hemorrhoids: Secondary | ICD-10-CM

## 2013-06-18 DIAGNOSIS — D126 Benign neoplasm of colon, unspecified: Secondary | ICD-10-CM

## 2013-06-18 DIAGNOSIS — E785 Hyperlipidemia, unspecified: Secondary | ICD-10-CM

## 2013-06-18 LAB — BASIC METABOLIC PANEL
BUN: 18 mg/dL (ref 6–23)
CO2: 30 mEq/L (ref 19–32)
Calcium: 9.8 mg/dL (ref 8.4–10.5)
Chloride: 104 mEq/L (ref 96–112)
Creatinine, Ser: 1 mg/dL (ref 0.4–1.2)

## 2013-06-18 LAB — CBC WITH DIFFERENTIAL/PLATELET
Basophils Absolute: 0 10*3/uL (ref 0.0–0.1)
Eosinophils Absolute: 0.2 10*3/uL (ref 0.0–0.7)
Eosinophils Relative: 2.7 % (ref 0.0–5.0)
HCT: 33.7 % — ABNORMAL LOW (ref 36.0–46.0)
Hemoglobin: 11.4 g/dL — ABNORMAL LOW (ref 12.0–15.0)
Lymphs Abs: 2.1 10*3/uL (ref 0.7–4.0)
MCHC: 33.8 g/dL (ref 30.0–36.0)
MCV: 87.6 fl (ref 78.0–100.0)
Monocytes Absolute: 0.7 10*3/uL (ref 0.1–1.0)
Monocytes Relative: 11.5 % (ref 3.0–12.0)
Neutro Abs: 3.4 10*3/uL (ref 1.4–7.7)
Neutrophils Relative %: 53.1 % (ref 43.0–77.0)
RDW: 13.6 % (ref 11.5–14.6)

## 2013-06-18 LAB — IBC PANEL: Iron: 86 ug/dL (ref 42–145)

## 2013-06-18 MED ORDER — LISINOPRIL 20 MG PO TABS: 20.0000 mg | ORAL_TABLET | Freq: Every day | ORAL | Status: DC

## 2013-06-18 MED ORDER — METOPROLOL SUCCINATE ER 50 MG PO TB24: 50.0000 mg | ORAL_TABLET | Freq: Every day | ORAL | Status: DC

## 2013-06-18 MED ORDER — HYDROCHLOROTHIAZIDE 12.5 MG PO CAPS: 12.5000 mg | ORAL_CAPSULE | ORAL | Status: DC

## 2013-06-18 NOTE — Patient Instructions (Signed)
Today we updated your med list in our EPIC system...    Continue your current medications the same...    We refilled the meds you requested...  Today we did your follow up labs as discussed...    We will contact you w/ the results when available...   We also gave you the 2014 flu vaccine...  Congrats on your up-coming retirement!  Call for any questions...  Let's plan a follow up visit in 50mo, sooner if needed for problems.Marland KitchenMarland Kitchen

## 2013-06-18 NOTE — Progress Notes (Signed)
Subjective:    Patient ID: Sara Rice, female    DOB: May 25, 1946, 67 y.o.   MRN: [id number]  HPI 67 y/o BF here for a folllow up visit... she is [name] sister & she works at the [employer]...   CXR 4/13 showed borderline heart size & ectatic Ao; clear lungs w/ mild right diaph eventration, obese, degen spondylosis... EKG 4/13 showed NSR, rate64, NSSTTWA, NAD... LABS 4/13:  FLP- ok on diet alone;  Chems- wnl;  CBC- ok x Hg=11.4 Fe=56;  TSH=1.38  ~  May 29, 2012:  50mo ROV & Sara Rice reports a good interval w/o new complaints or concerns;  BP is well controlled on her regimen & measures 138/86 today, similar at home she says & denies CP, palpit, SOB, edema, etc; she is exercising by walking & notes that her breathing is good; she is not really on a diet 7 we reviewed low sodium, low carb, wt reducing diet recs... We decided to do her Fasting blood work on return...    We reviewed prob list, meds, xrays and labs> see below for updates >> OK 2013 Flu vaccine today & refill prescriptions...  ~  Dec 04, 2012:  50mo ROV & Sara Rice is c/o arthritis pain through the winter esp in her legs from the knees down "esp when I walk"; offered XRays and Ortho eval but she says it's not that bad yet & declines further eval; we discussed Rx w/ Tramadol for prn use... We reviewed the following medical problems during today's office visit >>     HBP> on MetoprololER50, Lisinopril20, HCT12.5; BP= 130/76 & feels well- denies CP palpit, dizzy, SOB, or edema...    Right Carotid Bruit> on ASA81; CDoppler 5/13 w/ stable 40-59% right ICA stenosis, and no cerebral ischemic symptoms, f/u planned 29yrs...    Chol> on diet alone; FLP 5/14 shows TChol 119, tg 94, HDL 32, LDL 118    GI> Polyps, Hems> colon 8/10 by DrGessner w/ 15mm tubular adenoma removed; he recommended 50yr f/u & she is overdue- refer to GI...    Hx Kidney stone> no problem in many yrs, prev eval by DrNesi...    DJD> she uses OTC analgesics as needed;  c/o incr pain in legs from the knee down but she declines XRay or Ortho referral; try Tramadol50mg  prn use...    Vit D Defic> on Calcium, MVI, Vit D 1000u daily; she has not yet chosen a Gyn for f/u Pap, Mammograms, & BMD- encouraged to do so & offered to set this up for her but she declines...    Anemia> on OTC FeSO4 daily (only taking 65mg  tab); labs 5/14 showed Hg= 11.6 We reviewed prob list, meds, xrays and labs> see below for updates >>  LABS 5/14:  FLP- at goals on diet alone;  CBC- ok x Hg=11.6;  Fe=71 (25%sat);  TSH=1.28...  ~  June 18, 2013:  50mo ROV & Sara Rice's CC is arthritis pain in knees esp; using Tramadol w/ some relief; she is going to retire in 2015!  We reviewed the following medical problems during today's office visit >>     HBP> on MetoprololER50, Lisinopril20, HCT12.5; BP= 142/74 & feels well- denies CP palpit, dizzy, SOB, or edema...    Right Carotid Bruit> on ASA81; CDoppler 5/13 w/ stable 40-59% right ICA stenosis, and no cerebral ischemic symptoms, f/u planned 53yrs...    Chol> on diet alone; FLP 5/14 shows TChol 119, tg 94, HDL 32, LDL 118  GI> Polyps, Hems> colon 8/10 by DrGessner w/ 15mm tubular adenoma removed; he recommended 36yr f/u & she is overdue=> refer to GI...    Hx Kidney stone> no problem in many yrs, prev eval by DrNesi...    DJD> she uses OTC analgesics as needed; c/o incr pain in legs from the knee down but she declines XRay or Ortho referral; try Tramadol50mg  prn use...    Vit D Defic> on Calcium, MVI, Vit D 1000u daily; she has not yet chosen a Gyn for f/u Pap, Mammograms, & BMD- encouraged to do so & offered to set this up for her but she declines...    Mild chronic anemia> on OTC FeSO4 daily (only taking 65mg  tab); labs 11/14 showed Hg= 11.4, Fe=86 (32%sat); continue same... We reviewed prob list, meds, xrays and labs> see below for updates >> ok 2014 Flu vaccine today...  LABS 11/14:  Chems- wnl;  CBC- ok w/ Hg=11.4;  Fe=86 (32%sat)...            Problem List:      HYPERTENSION (ICD-401.9) - on METOPROLOL ER 50mg /d + LISINOPRIL 20mg /d, & HCTZ 12.5mg  daily... states she checks BP at the pharm and they are all OK...  ~  10/11:  BP= 146/78 & reminded to take meds regularly... denies HA, fatigue, visual changes, CP, palipit, dizziness, syncope, dyspnea, edema, etc...  ~  4/12:  BP= 148/90 but as noted she had run out of her meds recently; still asymptomatic & denies CP, palpit, SOB, edema, etc... ~  10/12:  BP= 130/82 & she remains asymptomatic, continue same meds. ~  4/13:  BP= 158/82 & reminded to take meds daily, get on diet, get wt down... ~  EKG 4/13 showed NSR, rate64, NSSTTWA, NAD.Marland Kitchen. ~  CXR 5/13 showed borderline heart size, aortic ectasia, clear lungs, degen spondylosis in spine, NAD... ~  10/13:  BP= 138/86 & she remains asymptomatic... ~  5/14:  on MetoprololER50, Lisinopril20, HCT12.5; BP= 130/76 & feels well- denies CP palpit, dizzy, SOB, or edema. ~  11/14: on MetoprololER50, Lisinopril20, HCT12.5; BP= 142/74 & feels well, remains asymptomatic...  CAROTID BRUIT (ICD-785.9) - on ASA 81mg /d... ~  CDoppler 5/11 showed mild smooth plaque on the right w/ 40-59% RICA stenosis & normal on left (f/u rec 16yrs) ~  F/u CDopplers 5/13 showed similar changes w/ 40-59% RICA stenosis & normal LICA flow (they rec f/u 69yrs)...  HYPERCHOLESTEROLEMIA, BORDERLINE (ICD-272.4) - she is on a low cholesterol, low fat diet... ~  FLP 9/07 showed TChol 166, TG 51, HDL 34, LDL 121 ~  FLP 4/11 showed TChol 168, TG 66, HDL 38, LDL 117... discussed better diet, incr exercise. ~  FLP 4/12 showed TChol 179, TG 88, HDL 40, LDL 121 ~  FLP 4/13 showed TChol 162, TG 89, HDL 40,LDL 104 ~  FLP 5/14 on diet alone showed TChol 119, tg 94, HDL 32, LDL 118   COLONIC POLYPS (ZOX-096.0) & HEMORRHOIDS - she had colonoscopy 8/10 by DrGessner w/ 15mm polyp at splenic flex= tubulovillous adenoma & f/u planned 59yrs... also had some hemorroids noted... ~  4/13:  She will be  due for f/u colonoscopy per DrGessner 8/13... ~  She hasn't had the f/u colonoscopy as yet & we will refer chart to DrGessner...  Hx of RENAL CALCULUS (ICD-592.0) - Rx by DrNesi in the past...  OSTEOARTHRITIS (ICD-715.90) - she uses OTC Tylenol arthritis & we discussed knee brace & Aleve Prn. ~  4/13: c/o aches and pains but still doing  OK on OTC analgesics... ~  11/14: she is c/o pain in knees & legs; offered Tramadol refill vs Ortho re-eval...  VITAMIN D DEFICIENCY (ICD-268.9) - on Vit D 1000 u OTC daily. ~  Vit D level 4/10 = 26... started on OTC Vit D 1000 u daily. ~  labs 4/11 showed Vit D level = 45... continue OTC Rx. ~  4/13: she remains on Vit D supplement; asked to sched her GYN check ASAP to include BMD testing...  ANEMIA (ICD-285.9) - on SLOW-Fe daily... ~  labs 9/07 showed Hg= 11.1, MCV 87, Fe= 48 w/ Sat 16%... rec OTC Fe daily but she stopped on her own. ~  labs 4/10 showed Hg= 11.3, MCV= 86... rec> continue Fe supplement daily. ~  labs 4/11 showed Hg= 10.6, MCV= 87, Fe= 90 (30%)... rec> continue same. ~  Labs 4/12 showed Hg= 11.4, MCV=89, Fe=63 (20%sat), B12= 712, SPE= neg w/o monoclonal prot identified... ~  Labs 4/13 showed Hg= 11.4, MCV= 88, Fe= 56 (21%sat) ~  Labs 5/14 showed Hg= 11.6, MCV= 88, Fe= 71 (25%sat) ~  Labs 11/14 showed Hg= 11.4, Fe= 86 (32%sat)... Continue same.  HEALTH MAINTENANCE:   ~  GI:  She is up to date on colon screening from DrGessner (see above)... ~  She needs GYN doctor and Pap, Mammogram, Baseline BMD, etc... she has promised to get a gynecologist for these important tests... ~  Immuniz:  She gets the yearly flu vaccines...   No past surgical history on file.   Outpatient Encounter Prescriptions as of 06/18/2013  Medication Sig  . aspirin 81 MG tablet Take 81 mg by mouth daily.    . cholecalciferol (VITAMIN D) 1000 UNITS tablet Take 1,000 Units by mouth daily.    . Ferrous Sulfate (IRON) 325 (65 FE) MG TABS Take 1 tablet by mouth daily.   . hydrochlorothiazide (MICROZIDE) 12.5 MG capsule Take 1 capsule (12.5 mg total) by mouth every morning.  Marland Kitchen lisinopril (PRINIVIL,ZESTRIL) 20 MG tablet Take 1 tablet (20 mg total) by mouth daily.  . metoprolol succinate (TOPROL-XL) 50 MG 24 hr tablet Take 1 tablet (50 mg total) by mouth daily.  . traMADol (ULTRAM) 50 MG tablet Take 1 tablet (50 mg total) by mouth 3 (three) times daily as needed for pain.    No Known Allergies   Current Medications, Allergies, Past Medical History, Past Surgical History, Family History, and Social History were reviewed in [employer] record.    Review of Systems        See HPI - all other systems neg except as noted...  The patient denies anorexia, fever, weight loss, weight gain, vision loss, decreased hearing, hoarseness, chest pain, syncope, dyspnea on exertion, peripheral edema, prolonged cough, headaches, hemoptysis, abdominal pain, melena, hematochezia, severe indigestion/heartburn, hematuria, incontinence, muscle weakness, suspicious skin lesions, transient blindness, difficulty walking, depression, unusual weight change, abnormal bleeding, enlarged lymph nodes, and angioedema.    Objective:   Physical Exam    WD, WN, 67 y/o BF in NAD...  GENERAL: Alert & oriented; pleasant & cooperative.  HEENT: Sara Rice/AT, EOM-wnl, PERRLA, EACs-clear, TMs-wnl, NOSE-clear, THROAT-clear & wnl.  NECK: Supple w/ fairROM; no JVD; sl diminished carotid impulses w/ faint L bruit, no thyromegaly or nodules palpated; no lymphadenopathy.  CHEST: Clear to P & A; without wheezes/ rales/ or rhonchi.  HEART: Regular Rhythm; without murmurs/ rubs/ or gallops.  ABDOMEN: Soft & nontender; normal bowel sounds; no organomegaly or masses detected.  EXT: without deformities, mild arthritic changes;  no varicose veins/ venous insuffic/ or edema.  NEURO: CN's intact; motor testing normal; sensory testing normal; gait normal & balance OK.  DERM: No lesions noted; no  rash etc...   RADIOLOGY DATA:  Reviewed in the EPIC EMR & discussed w/ the patient...  LABORATORY DATA:  Reviewed in the EPIC EMR & discussed w/ the patient...   Assessment & Plan:    HBP> On BBlocker, ACE, Diuretic as above; May need incr dosing but hard to tell w/ compliance issues; We discussed taking meds regularly every day & monitoring BP at home & work; She will call if BP >150/90 so we can increase meds...   CBruit> On ASA, no cerebral ischemic symptoms, continue Rx; she is due for f/u CDoppler & we will sched...  CHOL> On diet alone but LDL still sl up & needs better diet, incr exercise & esp to get wt down...   Colon polyp> Stable w/o GI symptoms; Due for f/u of her tubulovillous adenoma 8/13...   DJD> States she's doing satis w/ OTC meds...   Anemia> Improved/ stable on Fe + MVI etc, additional w/u in progress...    Patient's Medications  New Prescriptions   No medications on file  Previous Medications   ASPIRIN 81 MG TABLET    Take 81 mg by mouth daily.     CHOLECALCIFEROL (VITAMIN D) 1000 UNITS TABLET    Take 1,000 Units by mouth daily.     FERROUS SULFATE (IRON) 325 (65 FE) MG TABS    Take 1 tablet by mouth daily.   TRAMADOL (ULTRAM) 50 MG TABLET    Take 1 tablet (50 mg total) by mouth 3 (three) times daily as needed for pain.  Modified Medications   Modified Medication Previous Medication   HYDROCHLOROTHIAZIDE (MICROZIDE) 12.5 MG CAPSULE hydrochlorothiazide (MICROZIDE) 12.5 MG capsule      Take 1 capsule (12.5 mg total) by mouth every morning.    Take 1 capsule (12.5 mg total) by mouth every morning.   LISINOPRIL (PRINIVIL,ZESTRIL) 20 MG TABLET lisinopril (PRINIVIL,ZESTRIL) 20 MG tablet      Take 1 tablet (20 mg total) by mouth daily.    Take 1 tablet (20 mg total) by mouth daily.   METOPROLOL SUCCINATE (TOPROL-XL) 50 MG 24 HR TABLET metoprolol succinate (TOPROL-XL) 50 MG 24 hr tablet      Take 1 tablet (50 mg total) by mouth daily.    Take 1 tablet (50 mg total)  by mouth daily.  Discontinued Medications   No medications on file

## 2013-07-11 ENCOUNTER — Encounter: Payer: Self-pay | Admitting: *Deleted

## 2013-10-12 ENCOUNTER — Other Ambulatory Visit: Payer: Self-pay | Admitting: Pulmonary Disease

## 2013-10-12 DIAGNOSIS — E559 Vitamin D deficiency, unspecified: Secondary | ICD-10-CM

## 2013-10-12 DIAGNOSIS — D649 Anemia, unspecified: Secondary | ICD-10-CM

## 2013-10-12 DIAGNOSIS — I10 Essential (primary) hypertension: Secondary | ICD-10-CM

## 2013-12-10 ENCOUNTER — Telehealth: Payer: Self-pay | Admitting: Pulmonary Disease

## 2013-12-10 NOTE — Telephone Encounter (Signed)
Called and spoke with pt and she is aware that the new provider is coming in September to the [place] office.  Pt is aware to call them in July to set this appt up.  Pt is aware that if she needs anything until she can get set up with them that SN will be happy to see her.  Pt voiced her understanding and nothing further is needed.

## 2013-12-18 ENCOUNTER — Ambulatory Visit: Payer: BC Managed Care – PPO | Admitting: Pulmonary Disease

## 2014-05-14 ENCOUNTER — Ambulatory Visit (INDEPENDENT_AMBULATORY_CARE_PROVIDER_SITE_OTHER): Payer: Medicare Other | Admitting: Family Medicine

## 2014-05-14 ENCOUNTER — Encounter: Payer: Self-pay | Admitting: Family Medicine

## 2014-05-14 VITALS — BP 160/78 | HR 64 | Temp 98.4°F | Wt 174.0 lb

## 2014-05-14 DIAGNOSIS — I6521 Occlusion and stenosis of right carotid artery: Secondary | ICD-10-CM

## 2014-05-14 DIAGNOSIS — D126 Benign neoplasm of colon, unspecified: Secondary | ICD-10-CM | POA: Diagnosis not present

## 2014-05-14 DIAGNOSIS — E785 Hyperlipidemia, unspecified: Secondary | ICD-10-CM

## 2014-05-14 DIAGNOSIS — I10 Essential (primary) hypertension: Secondary | ICD-10-CM

## 2014-05-14 DIAGNOSIS — Z23 Encounter for immunization: Secondary | ICD-10-CM | POA: Diagnosis not present

## 2014-05-14 MED ORDER — METOPROLOL SUCCINATE ER 50 MG PO TB24: 50.0000 mg | ORAL_TABLET | Freq: Every day | ORAL | Status: DC

## 2014-05-14 MED ORDER — HYDROCHLOROTHIAZIDE 12.5 MG PO CAPS: 12.5000 mg | ORAL_CAPSULE | ORAL | Status: DC

## 2014-05-14 MED ORDER — LISINOPRIL 20 MG PO TABS: 20.0000 mg | ORAL_TABLET | Freq: Every day | ORAL | Status: DC

## 2014-05-14 MED ORDER — TRAMADOL HCL 50 MG PO TABS: 50.0000 mg | ORAL_TABLET | Freq: Three times a day (TID) | ORAL | Status: DC | PRN

## 2014-05-14 NOTE — Patient Instructions (Addendum)
They will call you with appointment to look at your carotid arteries again as suggested 2 year follow up previously.   Refer you back to GI doctors as they suggested repeat colonoscopy in 3 years in 2013.   Please call for mammogram (see handout)  See me within a month (try to do it in the morning) and we will catch you up on fasting labs and recheck your blood pressure.   Recommend 150 minutes walking per week.   DASH Eating Plan DASH stands for "Dietary Approaches to Stop Hypertension." The DASH eating plan is a healthy eating plan that has been shown to reduce high blood pressure (hypertension). Additional health benefits may include reducing the risk of type 2 diabetes mellitus, heart disease, and stroke. The DASH eating plan may also help with weight loss. WHAT DO I NEED TO KNOW ABOUT THE DASH EATING PLAN? For the DASH eating plan, you will follow these general guidelines:  Choose foods with a percent daily value for sodium of less than 5% (as listed on the food label).  Use salt-free seasonings or herbs instead of table salt or sea salt.  Check with your health care provider or pharmacist before using salt substitutes.  Eat lower-sodium products, often labeled as "lower sodium" or "no salt added."  Eat fresh foods.  Eat more vegetables, fruits, and low-fat dairy products.  Choose whole grains. Look for the word "whole" as the first word in the ingredient list.  Choose fish and skinless chicken or [place] more often than red meat. Limit fish, poultry, and meat to 6 oz (170 g) each day.  Limit sweets, desserts, sugars, and sugary drinks.  Choose heart-healthy fats.  Limit cheese to 1 oz (28 g) per day.  Eat more home-cooked food and less restaurant, buffet, and fast food.  Limit fried foods.  Cook foods using methods other than frying.  Limit canned vegetables. If you do use them, rinse them well to decrease the sodium.  When eating at a restaurant, ask that your food  be prepared with less salt, or no salt if possible. WHAT FOODS CAN I EAT? Seek help from a dietitian for individual calorie needs. Grains Whole grain or whole wheat bread. Brown rice. Whole grain or whole wheat pasta. Quinoa, bulgur, and whole grain cereals. Low-sodium cereals. Corn or whole wheat flour tortillas. Whole grain cornbread. Whole grain crackers. Low-sodium crackers. Vegetables Fresh or frozen vegetables (raw, steamed, roasted, or grilled). Low-sodium or reduced-sodium tomato and vegetable juices. Low-sodium or reduced-sodium tomato sauce and paste. Low-sodium or reduced-sodium canned vegetables.  Fruits All fresh, canned (in natural juice), or frozen fruits. Meat and Other Protein Products Ground beef (85% or leaner), grass-fed beef, or beef trimmed of fat. Skinless chicken or [place]. Ground chicken or [place]. Pork trimmed of fat. All fish and seafood. Eggs. Dried beans, peas, or lentils. Unsalted nuts and seeds. Unsalted canned beans. Dairy Low-fat dairy products, such as skim or 1% milk, 2% or reduced-fat cheeses, low-fat ricotta or cottage cheese, or plain low-fat yogurt. Low-sodium or reduced-sodium cheeses. Fats and Oils Tub margarines without trans fats. Light or reduced-fat mayonnaise and salad dressings (reduced sodium). Avocado. Safflower, olive, or canola oils. Natural peanut or almond butter. Other Unsalted popcorn and pretzels. The items listed above may not be a complete list of recommended foods or beverages. Contact your dietitian for more options. WHAT FOODS ARE NOT RECOMMENDED? Grains White bread. White pasta. White rice. Refined cornbread. Bagels and croissants. Crackers that contain trans fat. Vegetables  Creamed or fried vegetables. Vegetables in a cheese sauce. Regular canned vegetables. Regular canned tomato sauce and paste. Regular tomato and vegetable juices. Fruits Dried fruits. Canned fruit in light or heavy syrup. Fruit juice. Meat and Other Protein  Products Fatty cuts of meat. Ribs, chicken wings, bacon, sausage, bologna, salami, chitterlings, fatback, hot dogs, bratwurst, and packaged luncheon meats. Salted nuts and seeds. Canned beans with salt. Dairy Whole or 2% milk, cream, half-and-half, and cream cheese. Whole-fat or sweetened yogurt. Full-fat cheeses or blue cheese. Nondairy creamers and whipped toppings. Processed cheese, cheese spreads, or cheese curds. Condiments Onion and garlic salt, seasoned salt, table salt, and sea salt. Canned and packaged gravies. Worcestershire sauce. Tartar sauce. Barbecue sauce. Teriyaki sauce. Soy sauce, including reduced sodium. Steak sauce. Fish sauce. Oyster sauce. Cocktail sauce. Horseradish. Ketchup and mustard. Meat flavorings and tenderizers. Bouillon cubes. Hot sauce. Tabasco sauce. Marinades. Taco seasonings. Relishes. Fats and Oils Butter, stick margarine, lard, shortening, ghee, and bacon fat. Coconut, palm kernel, or palm oils. Regular salad dressings. Other Pickles and olives. Salted popcorn and pretzels. The items listed above may not be a complete list of foods and beverages to avoid. Contact your dietitian for more information. WHERE CAN I FIND MORE INFORMATION? National Heart, Lung, and Blood Institute: travelstabloid.com Document Released: 07/08/2011 Document Revised: 12/03/2013 Document Reviewed: 05/23/2013 [facility] Patient Information 2015 [place], [place]. This information is not intended to replace advice given to you by your health care provider. Make sure you discuss any questions you have with your health care provider.

## 2014-05-14 NOTE — Assessment & Plan Note (Signed)
Patient to return within the month. We will recheck her cholesterol at that time and recalculate 10 year cardiac risk. Continue aspirin alone until then. Carotid artery stenosis also makes me lean toward statin and we will discuss that at next visit

## 2014-05-14 NOTE — Progress Notes (Signed)
[name], MD Phone: [phone]  Subjective:  Patient presents today to establish care with me as their new primary care provider. Patient was formerly a patient of Dr. Lenna Gilford. Chief complaint-noted.   Hypertension-poor systolic control  BP Readings from Last 3 Encounters:  05/14/14 160/78  06/18/13 142/74  12/04/12 130/76   patient admits to having a hard time finding office and being very anxious to meet new provider. Previous blood pressures were much better controlled. Compliant with medications-, yes without side effects takes them at nighttime ROS-Denies any CP, HA, SOB, blurry vision, LE edema.  Hyperlipidemia-mild poor control Lab Results  Component Value Date   LDLCALC 118* 12/04/2012  On statin: No Regular exercise: Yes has to walk for work but not 150 minutes per week ROS- no chest pain or shortness of breath. No myalgias  Carotid artery stenosis-stable Last checked in 2013 ROS-no slurred words or extremity weakness  The following were reviewed and entered/updated in epic: Past Medical History  Diagnosis Date  . Unspecified essential hypertension   . Other symptoms involving cardiovascular system   . Other and unspecified hyperlipidemia   . Benign neoplasm of colon   . Unspecified hemorrhoids without mention of complication   . Calculus of kidney   . Osteoarthrosis, unspecified whether generalized or localized, unspecified site   . Unspecified vitamin D deficiency   . Anemia, unspecified   . HEMORRHOIDS 05/26/2009   Patient Active Problem List   Diagnosis Date Noted  . Carotid stenosis 06/18/2013    Priority: Medium  . Hyperlipidemia 05/26/2009    Priority: Medium  . Essential hypertension 08/21/2007    Priority: Medium  . COLONIC POLYPS 05/26/2009    Priority: Low  . VITAMIN D DEFICIENCY 05/26/2009    Priority: Low  . Anemia 08/21/2007    Priority: Low  . RENAL CALCULUS 08/21/2007    Priority: Low  . OSTEOARTHRITIS 08/21/2007    Priority:  Low   Past Surgical History  Procedure Laterality Date  . None      Family History  Problem Relation Age of Onset  . Diabetes Mother   . Hypertension Mother   . Stroke Brother     late 58s, nonsmoker  . Hypertension Brother   . Hypertension Sister     hx of obesity  . Colon polyps Mother     Medications- reviewed and updated Current Outpatient Prescriptions  Medication Sig Dispense Refill  . aspirin 81 MG tablet Take 81 mg by mouth daily.        . cholecalciferol (VITAMIN D) 1000 UNITS tablet Take 1,000 Units by mouth daily.        . Ferrous Sulfate (IRON) 325 (65 FE) MG TABS Take 1 tablet by mouth daily.      . hydrochlorothiazide (MICROZIDE) 12.5 MG capsule Take 1 capsule (12.5 mg total) by mouth every morning.  30 capsule  3  . lisinopril (PRINIVIL,ZESTRIL) 20 MG tablet Take 1 tablet (20 mg total) by mouth daily.  30 tablet  3  . metoprolol succinate (TOPROL-XL) 50 MG 24 hr tablet Take 1 tablet (50 mg total) by mouth daily.  30 tablet  3  . traMADol (ULTRAM) 50 MG tablet Take 1 tablet (50 mg total) by mouth 3 (three) times daily as needed.  60 tablet  5   No current facility-administered medications for this visit.    Allergies-reviewed and updated No Known Allergies  History   Social History  . Marital Status: Legally Separated    Spouse Name:  N/A    Number of Children: 2  . Years of Education: N/A   Occupational History  . [employer]   .  [employer]   Social History Main Topics  . Smoking status: Never Smoker   . Smokeless tobacco: Never Used  . Alcohol Use: No  . Drug Use: No  . Sexual Activity: None   Other Topics Concern  . None   Social History Narrative   Widowed in 2012 (rectal cancer-they were not together at that time). 2 kids. 1 grandson.       Retired from [employer] then returned to work 4 hours per day      Hobbies: shopping, reading, relaxing, gardening    ROS--See HPI   Objective: BP 160/78  Pulse  64  Temp(Src) 98.4 F (36.9 C)  Wt 174 lb (78.926 kg) Gen: NAD, resting comfortably HEENT: Mucous membranes are moist.  Neck: no thyromegaly CV: RRR no murmurs rubs or gallops Lungs: CTAB no crackles, wheeze, rhonchi Abdomen: soft/nontender/nondistended/normal bowel sounds.  Ext: no edema, 2+ PT pulses Skin: warm, dry Neuro: grossly normal, moves all extremities   Assessment/Plan:  Essential hypertension Poor control today on hydrochlorothiazide 12.5 mg, lisinopril 20 mg, metoprolol 50 mg. Gave patient handout on DASH diet. I do believe some of this could be anxiety related. To be sure we'll see back within the month and also get fasting blood work that time.  Hyperlipidemia Patient to return within the month. We will recheck her cholesterol at that time and recalculate 10 year cardiac risk. Continue aspirin alone until then. Carotid artery stenosis also makes me lean toward statin and we will discuss that at next visit  Carotid stenosis Update carotid Dopplers at this time as recommendations 2013 recommended 2 year followup. Left side was normal. Right side at 40-59% stenosis previously.   Needs updated colonoscopy as well Orders Placed This Encounter  Procedures  . Ambulatory referral to Gastroenterology    Referral Priority:  Routine    Referral Type:  Consultation    Referral Reason:  Specialty Services Required    Requested Specialty:  Gastroenterology    Number of Visits Requested:  1  . Doppler carotid    Standing Status: Future     Number of Occurrences:      Standing Expiration Date: 05/15/2015    Order Specific Question:  Laterality    Answer:  Bilateral    Order Specific Question:  Where should this test be performed:    Answer:  [facility]   Refill on medications Meds ordered this encounter  Medications  . traMADol (ULTRAM) 50 MG tablet    Sig: Take 1 tablet (50 mg total) by mouth 3 (three) times daily as needed.    Dispense:  60 tablet    Refill:  5    . hydrochlorothiazide (MICROZIDE) 12.5 MG capsule    Sig: Take 1 capsule (12.5 mg total) by mouth every morning.    Dispense:  30 capsule    Refill:  3  . lisinopril (PRINIVIL,ZESTRIL) 20 MG tablet    Sig: Take 1 tablet (20 mg total) by mouth daily.    Dispense:  30 tablet    Refill:  3  . metoprolol succinate (TOPROL-XL) 50 MG 24 hr tablet    Sig: Take 1 tablet (50 mg total) by mouth daily.    Dispense:  30 tablet    Refill:  3

## 2014-05-14 NOTE — Assessment & Plan Note (Signed)
Update carotid Dopplers at this time as recommendations 2013 recommended 2 year followup. Left side was normal. Right side at 40-59% stenosis previously.

## 2014-05-14 NOTE — Assessment & Plan Note (Signed)
Poor control today on hydrochlorothiazide 12.5 mg, lisinopril 20 mg, metoprolol 50 mg. Gave patient handout on DASH diet. I do believe some of this could be anxiety related. To be sure we'll see back within the month and also get fasting blood work that time.

## 2014-05-15 ENCOUNTER — Encounter (HOSPITAL_COMMUNITY): Payer: Medicare Other

## 2014-05-17 ENCOUNTER — Other Ambulatory Visit: Payer: Self-pay

## 2014-05-23 ENCOUNTER — Ambulatory Visit (HOSPITAL_COMMUNITY): Payer: Medicare Other | Attending: Family Medicine | Admitting: Cardiology

## 2014-05-23 DIAGNOSIS — I6521 Occlusion and stenosis of right carotid artery: Secondary | ICD-10-CM | POA: Insufficient documentation

## 2014-05-23 DIAGNOSIS — I10 Essential (primary) hypertension: Secondary | ICD-10-CM | POA: Insufficient documentation

## 2014-05-23 DIAGNOSIS — I6523 Occlusion and stenosis of bilateral carotid arteries: Secondary | ICD-10-CM

## 2014-05-23 DIAGNOSIS — E785 Hyperlipidemia, unspecified: Secondary | ICD-10-CM | POA: Diagnosis not present

## 2014-05-23 NOTE — Progress Notes (Signed)
Carotid duplex performed 

## 2014-05-27 ENCOUNTER — Encounter: Payer: Self-pay | Admitting: Family Medicine

## 2014-06-03 ENCOUNTER — Other Ambulatory Visit: Payer: Self-pay

## 2014-06-03 DIAGNOSIS — Z1231 Encounter for screening mammogram for malignant neoplasm of breast: Secondary | ICD-10-CM

## 2014-06-19 ENCOUNTER — Ambulatory Visit
Admission: RE | Admit: 2014-06-19 | Discharge: 2014-06-19 | Disposition: A | Discharge status: Home / Self Care | Payer: Medicare Other | Source: Ambulatory Visit

## 2014-06-19 DIAGNOSIS — Z1231 Encounter for screening mammogram for malignant neoplasm of breast: Secondary | ICD-10-CM

## 2014-06-25 ENCOUNTER — Ambulatory Visit (INDEPENDENT_AMBULATORY_CARE_PROVIDER_SITE_OTHER): Payer: Medicare Other | Admitting: Family Medicine

## 2014-06-25 ENCOUNTER — Encounter: Payer: Self-pay | Admitting: Family Medicine

## 2014-06-25 VITALS — BP 160/78 | HR 94 | Temp 98.0°F | Wt 171.0 lb

## 2014-06-25 DIAGNOSIS — I10 Essential (primary) hypertension: Secondary | ICD-10-CM

## 2014-06-25 DIAGNOSIS — E785 Hyperlipidemia, unspecified: Secondary | ICD-10-CM | POA: Diagnosis not present

## 2014-06-25 DIAGNOSIS — Z23 Encounter for immunization: Secondary | ICD-10-CM | POA: Diagnosis not present

## 2014-06-25 LAB — COMPREHENSIVE METABOLIC PANEL
ALT: 20 U/L (ref 0–35)
AST: 19 U/L (ref 0–37)
Albumin: 3.9 g/dL (ref 3.5–5.2)
Alkaline Phosphatase: 74 U/L (ref 39–117)
BUN: 25 mg/dL — AB (ref 6–23)
CHLORIDE: 102 meq/L (ref 96–112)
CO2: 27 mEq/L (ref 19–32)
Calcium: 9.5 mg/dL (ref 8.4–10.5)
Creatinine, Ser: 1.4 mg/dL — ABNORMAL HIGH (ref 0.4–1.2)
GFR: 40.92 mL/min — ABNORMAL LOW (ref >60.00)
GLUCOSE: 85 mg/dL (ref 70–99)
Potassium: 3.9 mEq/L (ref 3.5–5.1)
Sodium: 138 mEq/L (ref 135–145)
Total Bilirubin: 0.4 mg/dL (ref 0.2–1.2)
Total Protein: 8.2 g/dL (ref 6.0–8.3)

## 2014-06-25 LAB — CBC
HEMATOCRIT: 33.4 % — AB (ref 36.0–46.0)
HEMOGLOBIN: 11.2 g/dL — AB (ref 12.0–15.0)
MCHC: 33.4 g/dL (ref 30.0–36.0)
MCV: 89.2 fl (ref 78.0–100.0)
Platelets: 327 10*3/uL (ref 150.0–400.0)
RBC: 3.74 Mil/uL — ABNORMAL LOW (ref 3.87–5.11)
RDW: 13.9 % (ref 11.5–15.5)
WBC: 5.7 10*3/uL (ref 4.0–10.5)

## 2014-06-25 LAB — TSH: TSH: 1.24 u[IU]/mL (ref 0.35–4.50)

## 2014-06-25 MED ORDER — LISINOPRIL-HYDROCHLOROTHIAZIDE 20-12.5 MG PO TABS: 1.0000 | ORAL_TABLET | Freq: Two times a day (BID) | ORAL | Status: DC

## 2014-06-25 NOTE — Patient Instructions (Addendum)
Keba Prevnar today , postpone pneumovax 1 year HLD- CBC, CMET, Lipid panel, TSH  Sara Rice Take lisinopril 20 mg and hctz 12.5 mg still at night but also take 1 of each in the morning until you run out then start taking the combination pill 1 in AM and 1 in evening  We will send lab results through [facility] Maintenance Due  Topic Date Due  . TETANUS/TDAP - eventually ? 11/01/1966  . ZOSTAVAX -call your insurance 11/01/2007  . COLONOSCOPY - reschedule 03/03/2012  . PNEUMOCOCCAL POLYSACCHARIDE VACCINE AGE 58 AND OVER - today and then in 1 year 10/31/2012

## 2014-06-25 NOTE — Assessment & Plan Note (Signed)
Poor control on 2 subsequent exams  With systolic at 878. We will increase her lisinopril hydrochlorothiazide to Max dose by having her take 20-12.5 mg combo pill in the morning as well as the evening. Planned follow-up in 2 weeks.

## 2014-06-25 NOTE — Progress Notes (Signed)
  [name], MD Phone: [phone]  Subjective:   Sara Rice is a 68 y.o. year old very pleasant female patient who presents with the following:  Hypertension-poor control on hctz 12.5mg , lisinopril 20mg  tiwce a day, metoprolol 50mg  BP Readings from Last 3 Encounters:  06/25/14 160/78  05/14/14 160/78  06/18/13 142/74  Home BP monitoring-checking at drug store and around 160 SBP Walking everywhere and commuting on bus Compliant with medications-yes without side effects, some grogginess but does better when takes in daytime.  ROS-Denies any CP, HA, SOB, blurry vision, LE edema.   Past Medical History- Patient Active Problem List   Diagnosis Date Noted  . Hyperlipidemia 05/26/2009    Priority: Medium  . Essential hypertension 08/21/2007    Priority: Medium  . Carotid stenosis 06/18/2013    Priority: Low  . COLONIC POLYPS 05/26/2009    Priority: Low  . VITAMIN D DEFICIENCY 05/26/2009    Priority: Low  . Anemia 08/21/2007    Priority: Low  . RENAL CALCULUS 08/21/2007    Priority: Low  . OSTEOARTHRITIS 08/21/2007    Priority: Low   Medications- reviewed and updated Current Outpatient Prescriptions  Medication Sig Dispense Refill  . aspirin 81 MG tablet Take 81 mg by mouth daily.      . cholecalciferol (VITAMIN D) 1000 UNITS tablet Take 1,000 Units by mouth daily.      . Ferrous Sulfate (IRON) 325 (65 FE) MG TABS Take 1 tablet by mouth daily.    . metoprolol succinate (TOPROL-XL) 50 MG 24 hr tablet Take 1 tablet (50 mg total) by mouth daily. 30 tablet 3  . lisinopril-hydrochlorothiazide (PRINZIDE,ZESTORETIC) 20-12.5 MG per tablet Take 1 tablet by mouth 2 (two) times daily. 60 tablet 5  . traMADol (ULTRAM) 50 MG tablet Take 1 tablet (50 mg total) by mouth 3 (three) times daily as needed. (Patient not taking: Reported on 06/25/2014) 60 tablet 5   No current facility-administered medications for this visit.    Objective: BP 160/78 mmHg  Pulse 94  Temp(Src) 98  F (36.7 C)  Wt 171 lb (77.565 kg) Gen: NAD, resting comfortably CV: RRR no murmurs rubs or gallops Lungs: CTAB no crackles, wheeze, rhonchi Abdomen: soft/nontender/nondistended/normal bowel sounds.  Ext: no edema Skin: warm, dry, no rash Neuro: grossly normal, moves all extremities   Assessment/Plan:  Essential hypertension Poor control on 2 subsequent exams  With systolic at 703. We will increase her lisinopril hydrochlorothiazide to Max dose by having her take 20-12.5 mg combo pill in the morning as well as the evening. Planned follow-up in 2 weeks.   Return precautions advised.   Orders Placed This Encounter  Procedures  . Pneumococcal conjugate vaccine 13-valent  . CBC (no diff)  . TSH  . Lipid panel    Standing Status: Future     Number of Occurrences:      Standing Expiration Date: 06/26/2015  . Comprehensive metabolic panel    Meds ordered this encounter  Medications  . lisinopril-hydrochlorothiazide (PRINZIDE,ZESTORETIC) 20-12.5 MG per tablet    Sig: Take 1 tablet by mouth 2 (two) times daily.    Dispense:  60 tablet    Refill:  5

## 2014-07-09 ENCOUNTER — Encounter: Payer: Self-pay | Admitting: Family Medicine

## 2014-07-09 ENCOUNTER — Ambulatory Visit (INDEPENDENT_AMBULATORY_CARE_PROVIDER_SITE_OTHER): Payer: Medicare Other | Admitting: Family Medicine

## 2014-07-09 VITALS — BP 170/74 | HR 79 | Temp 98.0°F | Wt 172.0 lb

## 2014-07-09 DIAGNOSIS — I10 Essential (primary) hypertension: Secondary | ICD-10-CM | POA: Diagnosis not present

## 2014-07-09 MED ORDER — AMLODIPINE BESYLATE 10 MG PO TABS: 10.0000 mg | ORAL_TABLET | Freq: Every day | ORAL | Status: DC

## 2014-07-09 NOTE — Assessment & Plan Note (Signed)
Poor control with max.lisinopril hydrochlorothiazide. Metoprolol not at max dose but think we need an additional agent so will start amlodipine 10mg  (instructions per avs). Follow-up 7-10 days. May need to increase metoprolol. Patient also snores incontinence or obstructive sleep apnea testing. We get for medications and consider investigating causes other than essential hypertension

## 2014-07-09 NOTE — Patient Instructions (Signed)
Start amlodipine 5mg  (1/2 tab) for 1 week and after a week if you are tolerating it increase to full tab.   See me in 7-10 days for blood pressure recheck

## 2014-07-09 NOTE — Progress Notes (Signed)
  [name], MD Phone: [phone]  Subjective:   Sara Rice is a 68 y.o. year old very pleasant female patient who presents with the following:  Hypertension-poor control on lisinopril-hctz 20-12.5mg  BID, metoprolol 50mg  XL BP Readings from Last 3 Encounters:  07/09/14 170/74  06/25/14 160/78  05/14/14 160/78  Home BP monitoring-nothing since last visit Walking everywhere and commuting on bus, no regular planned exercise Compliant with medications-yes  ROS-Denies any CP, HA, SOB, blurry vision, LE edema. Son does tell her she snores a good bit.   Past Medical History- Patient Active Problem List   Diagnosis Date Noted  . Hyperlipidemia 05/26/2009    Priority: Medium  . Essential hypertension 08/21/2007    Priority: Medium  . Carotid stenosis 06/18/2013    Priority: Low  . COLONIC POLYPS 05/26/2009    Priority: Low  . VITAMIN D DEFICIENCY 05/26/2009    Priority: Low  . Anemia 08/21/2007    Priority: Low  . RENAL CALCULUS 08/21/2007    Priority: Low  . OSTEOARTHRITIS 08/21/2007    Priority: Low   Medications- reviewed and updated Current Outpatient Prescriptions  Medication Sig Dispense Refill  . aspirin 81 MG tablet Take 81 mg by mouth daily.      . cholecalciferol (VITAMIN D) 1000 UNITS tablet Take 1,000 Units by mouth daily.      Marland Kitchen lisinopril-hydrochlorothiazide (PRINZIDE,ZESTORETIC) 20-12.5 MG per tablet Take 1 tablet by mouth 2 (two) times daily. 60 tablet 5  . metoprolol succinate (TOPROL-XL) 50 MG 24 hr tablet Take 1 tablet (50 mg total) by mouth daily. 30 tablet 3  . amLODipine (NORVASC) 10 MG tablet Take 1 tablet (10 mg total) by mouth daily. 30 tablet 5  . Ferrous Sulfate (IRON) 325 (65 FE) MG TABS Take 1 tablet by mouth daily.    . traMADol (ULTRAM) 50 MG tablet Take 1 tablet (50 mg total) by mouth 3 (three) times daily as needed. (Patient not taking: Reported on 06/25/2014) 60 tablet 5   No current facility-administered medications for this  visit.    Objective: BP 170/74 mmHg  Pulse 79  Temp(Src) 98 F (36.7 C)  Wt 172 lb (78.019 kg) Similar BP on repeat 168/90 Gen: NAD, resting comfortably CV: RRR no murmurs rubs or gallops Lungs: CTAB no crackles, wheeze, rhonchi Ext: no edema Skin: warm, dry, no rash Neuro: grossly normal, moves all extremities   Assessment/Plan:  Essential hypertension Poor control with max.lisinopril hydrochlorothiazide. Metoprolol not at max dose but think we need an additional agent so will start amlodipine 10mg  (instructions per avs). Follow-up 7-10 days. May need to increase metoprolol. Patient also snores incontinence or obstructive sleep apnea testing. We get for medications and consider investigating causes other than essential hypertension   Return precautions advised and reasons to seek emergent/urgent care.   Meds ordered this encounter  Medications  . amLODipine (NORVASC) 10 MG tablet    Sig: Take 1 tablet (10 mg total) by mouth daily.    Dispense:  30 tablet    Refill:  5

## 2014-07-23 ENCOUNTER — Encounter: Payer: Self-pay | Admitting: Family Medicine

## 2014-07-23 ENCOUNTER — Ambulatory Visit (INDEPENDENT_AMBULATORY_CARE_PROVIDER_SITE_OTHER): Payer: Medicare Other | Admitting: Family Medicine

## 2014-07-23 VITALS — BP 110/72 | Temp 98.3°F | Wt 169.0 lb

## 2014-07-23 DIAGNOSIS — E785 Hyperlipidemia, unspecified: Secondary | ICD-10-CM

## 2014-07-23 DIAGNOSIS — I10 Essential (primary) hypertension: Secondary | ICD-10-CM

## 2014-07-23 MED ORDER — AMLODIPINE BESYLATE 5 MG PO TABS: 5.0000 mg | ORAL_TABLET | Freq: Every day | ORAL | Status: DC

## 2014-07-23 NOTE — Assessment & Plan Note (Signed)
Poor control last check. Check fasting lipids (patient to return) and calculate 10 year cardiac/CVA risk to decide on need for statin. Already on asa to lower CVA risk.

## 2014-07-23 NOTE — Patient Instructions (Addendum)
Blood pressure looks great. Goal is to stay below 140/90.   Come back in 2-3 weeks for fasting labs to check your cholesterol and kidney function.   Changed to 5mg  pill for you to take when you run out of the 10mg  amlodipine pill that you are taking half of.   Then see me in 6 months unless you need me sooner

## 2014-07-23 NOTE — Progress Notes (Signed)
[name], MD Phone: [phone]  Subjective:   WM FRUCHTER is a 68 y.o. year old very pleasant female patient who presents with the following:  Hypertension-poor control on lisinopril-hctz 20-12.5mg  BID, metoprolol 50mg  XL and now with recent addition of amlodipine 5mg .  BP Readings from Last 3 Encounters:  07/23/14 110/72  07/09/14 170/74  06/25/14 160/78  Home BP monitoring-12--135/60-80 Walking everywhere and commuting on bus, no regular planned exercise Compliant with medications-yes  ROS-Denies any CP, HA, SOB, blurry vision, LE edema. Son does tell her she snores a good bit.   Hyperlipidemia-poor control previously  Lab Results  Component Value Date   LDLCALC 118* 12/04/2012  On statin: no Regular exercise: see above Diet: many poor choices ROS- no chest pain or shortness of breath. No myalgias   Past Medical History- Patient Active Problem List   Diagnosis Date Noted  . Hyperlipidemia 05/26/2009    Priority: Medium  . Essential hypertension 08/21/2007    Priority: Medium  . Carotid stenosis 06/18/2013    Priority: Low  . COLONIC POLYPS 05/26/2009    Priority: Low  . VITAMIN D DEFICIENCY 05/26/2009    Priority: Low  . Anemia 08/21/2007    Priority: Low  . RENAL CALCULUS 08/21/2007    Priority: Low  . OSTEOARTHRITIS 08/21/2007    Priority: Low   Medications- reviewed and updated Current Outpatient Prescriptions  Medication Sig Dispense Refill  . amLODipine (NORVASC) 5 MG tablet Take 1 tablet (5 mg total) by mouth daily. 30 tablet 11  . aspirin 81 MG tablet Take 81 mg by mouth daily.      . cholecalciferol (VITAMIN D) 1000 UNITS tablet Take 1,000 Units by mouth daily.      . Ferrous Sulfate (IRON) 325 (65 FE) MG TABS Take 1 tablet by mouth daily.    Marland Kitchen lisinopril-hydrochlorothiazide (PRINZIDE,ZESTORETIC) 20-12.5 MG per tablet Take 1 tablet by mouth 2 (two) times daily. 60 tablet 5  . metoprolol succinate (TOPROL-XL) 50 MG 24 hr tablet Take 1  tablet (50 mg total) by mouth daily. 30 tablet 3  . traMADol (ULTRAM) 50 MG tablet Take 1 tablet (50 mg total) by mouth 3 (three) times daily as needed. (Patient not taking: Reported on 06/25/2014) 60 tablet 5   Objective: BP 110/72 mmHg  Temp(Src) 98.3 F (36.8 C)  Wt 169 lb (76.658 kg) Gen: NAD, resting comfortably CV: RRR no murmurs rubs or gallops Lungs: CTAB no crackles, wheeze, rhonchi Ext: no edema Skin: warm, dry, no rash Neuro: grossly normal, moves all extremities, PERRLA   Assessment/Plan:  Essential hypertension Controlled with addition of amlodipine 5mg  to Lisinopril-hctz 20-12.5mg  BID, metoprolol 50mg  XL. Continue. Creatinine had bumped on last check so we will repeat when patient returns for fasting lipids-may have been BP related.   Hyperlipidemia Poor control last check. Check fasting lipids (patient to return) and calculate 10 year cardiac/CVA risk to decide on need for statin. Already on asa to lower CVA risk.   Return precautions advised. Likely 6 month follow up (unless lab abnormalities)  Future fasting labs Orders Placed This Encounter  Procedures  . Basic metabolic panel    [place]    Standing Status: Future     Number of Occurrences:      Standing Expiration Date: 07/24/2015    Order Specific Question:  Has the patient fasted?    Answer:  No  . Lipid panel    [place]    Standing Status: Future     Number of Occurrences:  Standing Expiration Date: 07/24/2015    Order Specific Question:  Has the patient fasted?    Answer:  No   Meds ordered this encounter  Medications  . amLODipine (NORVASC) 5 MG tablet    Sig: Take 1 tablet (5 mg total) by mouth daily.    Dispense:  30 tablet    Refill:  11    Decrease from 10mg  on 07/23/14

## 2014-07-23 NOTE — Assessment & Plan Note (Signed)
Controlled with addition of amlodipine 5mg  to Lisinopril-hctz 20-12.5mg  BID, metoprolol 50mg  XL. Continue. Creatinine had bumped on last check so we will repeat when patient returns for fasting lipids-may have been BP related.

## 2014-08-13 ENCOUNTER — Other Ambulatory Visit (INDEPENDENT_AMBULATORY_CARE_PROVIDER_SITE_OTHER): Payer: Medicare Other

## 2014-08-13 ENCOUNTER — Encounter: Payer: Self-pay | Admitting: Family Medicine

## 2014-08-13 DIAGNOSIS — E785 Hyperlipidemia, unspecified: Secondary | ICD-10-CM | POA: Diagnosis not present

## 2014-08-13 DIAGNOSIS — Z79899 Other long term (current) drug therapy: Secondary | ICD-10-CM | POA: Diagnosis not present

## 2014-08-13 DIAGNOSIS — I10 Essential (primary) hypertension: Secondary | ICD-10-CM | POA: Diagnosis not present

## 2014-08-13 LAB — BASIC METABOLIC PANEL
BUN: 31 mg/dL — AB (ref 6–23)
CALCIUM: 9.5 mg/dL (ref 8.4–10.5)
CO2: 26 mEq/L (ref 19–32)
CREATININE: 1.6 mg/dL — AB (ref 0.4–1.2)
Chloride: 105 mEq/L (ref 96–112)
GFR: 34.19 mL/min — ABNORMAL LOW (ref >60.00)
GLUCOSE: 96 mg/dL (ref 70–99)
Potassium: 4.1 mEq/L (ref 3.5–5.1)
SODIUM: 136 meq/L (ref 135–145)

## 2014-08-13 LAB — LIPID PANEL
CHOLESTEROL: 185 mg/dL (ref 0–200)
HDL: 30.5 mg/dL — AB (ref >39.00)
LDL CALC: 130 mg/dL — AB (ref 0–99)
NonHDL: 154.5
Total CHOL/HDL Ratio: 6
Triglycerides: 124 mg/dL (ref 0.0–149.0)
VLDL: 24.8 mg/dL (ref 0.0–40.0)

## 2014-08-26 ENCOUNTER — Encounter: Payer: Self-pay | Admitting: Family Medicine

## 2014-09-16 ENCOUNTER — Telehealth: Payer: Self-pay

## 2014-09-16 MED ORDER — METOPROLOL SUCCINATE ER 50 MG PO TB24: 50.0000 mg | ORAL_TABLET | Freq: Every day | ORAL | Status: DC

## 2014-09-16 NOTE — Telephone Encounter (Signed)
[facility] refill request for METOPROLOL 50MG  TAB #30 with 3 refills

## 2014-09-16 NOTE — Telephone Encounter (Signed)
Medication refilled

## 2015-01-02 ENCOUNTER — Telehealth: Payer: Self-pay

## 2015-01-02 MED ORDER — LISINOPRIL-HYDROCHLOROTHIAZIDE 20-12.5 MG PO TABS: 1.0000 | ORAL_TABLET | Freq: Two times a day (BID) | ORAL | Status: DC

## 2015-01-02 NOTE — Telephone Encounter (Signed)
Medication has been refilled.

## 2015-01-02 NOTE — Telephone Encounter (Signed)
[place], [place] [address]: lisinopril-hydrochlorothiazide (PRINZIDE,ZESTORETIC) 20-12.5 MG per tablet

## 2015-01-20 ENCOUNTER — Telehealth: Payer: Self-pay

## 2015-01-20 MED ORDER — AMLODIPINE BESYLATE 5 MG PO TABS: 5.0000 mg | ORAL_TABLET | Freq: Every day | ORAL | Status: DC

## 2015-01-20 MED ORDER — LISINOPRIL-HYDROCHLOROTHIAZIDE 20-12.5 MG PO TABS: 1.0000 | ORAL_TABLET | Freq: Two times a day (BID) | ORAL | Status: DC

## 2015-01-20 MED ORDER — METOPROLOL SUCCINATE ER 50 MG PO TB24: 50.0000 mg | ORAL_TABLET | Freq: Every day | ORAL | Status: DC

## 2015-01-20 NOTE — Telephone Encounter (Signed)
Medications refilled

## 2015-01-20 NOTE — Telephone Encounter (Signed)
[place], [place] [address]: amLODipine (NORVASC) 5 MG tablet, lisinopril-hydrochlorothiazide (PRINZIDE,ZESTORETIC) 20-12.5 MG per tablet, metoprolol succinate (TOPROL-XL) 50 MG 24 hr tablet

## 2015-01-21 ENCOUNTER — Ambulatory Visit (INDEPENDENT_AMBULATORY_CARE_PROVIDER_SITE_OTHER): Payer: Medicare Other | Admitting: Family Medicine

## 2015-01-21 ENCOUNTER — Encounter: Payer: Self-pay | Admitting: Family Medicine

## 2015-01-21 ENCOUNTER — Ambulatory Visit: Payer: Medicare Other | Admitting: Family Medicine

## 2015-01-21 VITALS — BP 150/72 | HR 70 | Temp 98.2°F | Wt 167.0 lb

## 2015-01-21 DIAGNOSIS — R748 Abnormal levels of other serum enzymes: Secondary | ICD-10-CM | POA: Diagnosis not present

## 2015-01-21 DIAGNOSIS — N183 Chronic kidney disease, stage 3 unspecified: Secondary | ICD-10-CM

## 2015-01-21 DIAGNOSIS — Z780 Asymptomatic menopausal state: Secondary | ICD-10-CM

## 2015-01-21 DIAGNOSIS — Z1211 Encounter for screening for malignant neoplasm of colon: Secondary | ICD-10-CM

## 2015-01-21 DIAGNOSIS — I10 Essential (primary) hypertension: Secondary | ICD-10-CM | POA: Diagnosis not present

## 2015-01-21 DIAGNOSIS — R7989 Other specified abnormal findings of blood chemistry: Secondary | ICD-10-CM

## 2015-01-21 DIAGNOSIS — R300 Dysuria: Secondary | ICD-10-CM | POA: Diagnosis not present

## 2015-01-21 LAB — BASIC METABOLIC PANEL
BUN: 38 mg/dL — ABNORMAL HIGH (ref 6–23)
CALCIUM: 9.9 mg/dL (ref 8.4–10.5)
CO2: 28 mEq/L (ref 19–32)
Chloride: 107 mEq/L (ref 96–112)
Creatinine, Ser: 1.76 mg/dL — ABNORMAL HIGH (ref 0.40–1.20)
GFR: 30.59 mL/min — AB (ref >60.00)
GLUCOSE: 91 mg/dL (ref 70–99)
Potassium: 4.6 mEq/L (ref 3.5–5.1)
Sodium: 140 mEq/L (ref 135–145)

## 2015-01-21 LAB — POCT URINALYSIS DIPSTICK
BILIRUBIN UA: NEGATIVE
Glucose, UA: NEGATIVE
Ketones, UA: NEGATIVE
Nitrite, UA: NEGATIVE
Protein, UA: NEGATIVE
RBC UA: NEGATIVE
Spec Grav, UA: 1.01
Urobilinogen, UA: 0.2
pH, UA: 5.5

## 2015-01-21 LAB — URINALYSIS, MICROSCOPIC ONLY: RBC / HPF: NONE SEEN (ref 0–?)

## 2015-01-21 MED ORDER — METOPROLOL SUCCINATE ER 50 MG PO TB24: 50.0000 mg | ORAL_TABLET | Freq: Every day | ORAL | Status: DC

## 2015-01-21 MED ORDER — LISINOPRIL-HYDROCHLOROTHIAZIDE 20-12.5 MG PO TABS: 1.0000 | ORAL_TABLET | Freq: Every day | ORAL | Status: DC

## 2015-01-21 MED ORDER — AMLODIPINE BESYLATE 10 MG PO TABS: 10.0000 mg | ORAL_TABLET | Freq: Every day | ORAL | Status: DC

## 2015-01-21 NOTE — Assessment & Plan Note (Signed)
Continue Lisinopril-hctz 20-12.5mg  BID, metoprolol 50mg  XL. Start full tab amlodipine 5mg  (had been taking 1/2). Depending on creatinine if elevated- likely reduce lisinopril-hctz to once a day and increase amlodipine to full tab.

## 2015-01-21 NOTE — Patient Instructions (Addendum)
[facility] will call you to schedule colonoscopy. In 2013, Dr. Carlean Purl had recommended a repeat  Get bone density scheduled at checkout today. We do not have record of one of these  Take full tab of amlodipine. Check blood in urine today. Let's plan on following up in 1 month about your blood pressure or kidney's or sooner if any worsening potentially

## 2015-01-21 NOTE — Progress Notes (Signed)
[name], MD  Subjective:  Sara Rice is a 69 y.o. year old very pleasant female patient who presents with:  Hypertension-mild poor control. Only taking 1/2 of amlodipine 5mg  due to some grogginess at times on it which improved on 1/2 tab.   BP Readings from Last 3 Encounters:  01/21/15 150/72  07/23/14 110/72  07/09/14 170/74   Home BP monitoring-this morning was 115/ 69. Usually top number 115-130.  Still low sodium diet.  Compliant with medications-yes without side effects ROS-Denies any CP, HA, SOB, blurry vision, LE edema, transient weakness, orthopnea, PND.   Creatinine elevation/ subacute vs. Chronic kidney injury in additon to baseline CKD III Creatinine increased from 1.4 7 months ago to 1.6 5 months ago when previously 1-1.2. Baseline GFR in high 40s to high 50s but with Creatinine increase now into low 40s or even into 30s with Cr 1.6. Patient denies nsaid use. Has had hypertension issues. Hyperlipidemia untreated previously. States does get dry some days she is working and can work on that. Is on diuretic and ace combo pill ROS- denies change in urination pattern, history of kidney stones but no flank pain  Past Medical History- Hyperlipidemia  Medications- reviewed and updated Current Outpatient Prescriptions  Medication Sig Dispense Refill  . amLODipine (NORVASC) 5 MG tablet Take 1 tablet (5 mg total) by mouth daily. 30 tablet 5  . aspirin 81 MG tablet Take 81 mg by mouth daily.      . cholecalciferol (VITAMIN D) 1000 UNITS tablet Take 1,000 Units by mouth daily.      . Ferrous Sulfate (IRON) 325 (65 FE) MG TABS Take 1 tablet by mouth daily.    Marland Kitchen lisinopril-hydrochlorothiazide (PRINZIDE,ZESTORETIC) 20-12.5 MG per tablet Take 1 tablet by mouth 2 (two) times daily. 60 tablet 5  . metoprolol succinate (TOPROL-XL) 50 MG 24 hr tablet Take 1 tablet (50 mg total) by mouth daily. 30 tablet 5  . traMADol (ULTRAM) 50 MG tablet Take 1 tablet (50 mg total) by mouth 3  (three) times daily as needed. (Patient not taking: Reported on 06/25/2014) 60 tablet 5   Objective: BP 150/72 mmHg  Pulse 70  Temp(Src) 98.2 F (36.8 C)  Wt 167 lb (75.751 kg)  Manual repeat the same BP Gen: NAD, resting comfortably CV: RRR no murmurs rubs or gallops Lungs: CTAB no crackles, wheeze, rhonchi Abdomen: soft/nontender/nondistended/normal bowel sounds. Overweight Ext: no edema Skin: warm, dry, no rash Neuro: grossly normal, moves all extremities  Assessment/Plan:  CKD (chronic kidney disease), stage III Subacute kidney injury/creatinine elevation Baseline CKD III with GFR 1-1.2 at baseline with GFR high 40s to high 50s. Depressed into low 30s or 40s 2016 unclear cause. Already avoiding nsaids. Poor BP today- advised increasing amlodipine to full 5mg . We will check a bmet and urine with urine micro as well today. Consider microalb/cr ratio in future potentially. We discussed if Cr same or worse as last visit at 1.6 we would get an ultrasound of kidneys as well as increase amlodipine to 10mg  and reduce lisinopril-hctz to once a day with follow up BP check within a month. Does have kidney stone history which could contribute  Essential hypertension Continue Lisinopril-hctz 20-12.5mg  BID, metoprolol 50mg  XL. Start full tab amlodipine 5mg  (had been taking 1/2). Depending on creatinine if elevated- likely reduce lisinopril-hctz to once a day and increase amlodipine to full tab.   Needed 3 year repeat colonoscopy 2013, referred again today Never had bone density study Orders Placed This Encounter  Procedures  .  DG Bone Density    Standing Status: Future     Number of Occurrences:      Standing Expiration Date: 03/22/2016    Order Specific Question:  Reason for Exam (SYMPTOM  OR DIAGNOSIS REQUIRED)    Answer:  post menopausal    Order Specific Question:  Preferred imaging location?    Answer:  [facility]  . Urine Microscopic Only  . Basic metabolic panel    [place]   . Ambulatory referral to Gastroenterology    Referral Priority:  Routine    Referral Type:  Consultation    Referral Reason:  Specialty Services Required    Number of Visits Requested:  1  . POCT urinalysis dipstick    In house    Meds ordered this encounter  Medications  . metoprolol succinate (TOPROL-XL) 50 MG 24 hr tablet    Sig: Take 1 tablet (50 mg total) by mouth daily.    Dispense:  30 tablet    Refill:  5

## 2015-01-21 NOTE — Addendum Note (Signed)
Addended by: Marin Olp on: 01/21/2015 05:35 PM   Modules accepted: Orders

## 2015-01-21 NOTE — Assessment & Plan Note (Addendum)
Baseline CKD III with GFR 1-1.2 at baseline with GFR high 40s to high 50s. Depressed into low 30s or 40s 2016 unclear cause. Already avoiding nsaids. Poor BP today- advised increasing amlodipine to full 5mg . We will check a bmet and urine with urine micro as well today. Consider microalb/cr ratio in future potentially. We discussed if Cr same or worse as last visit at 1.6 we would get an ultrasound of kidneys as well as increase amlodipine to 10mg  and reduce lisinopril-hctz to once a day with follow up BP check within a month. Does have kidney stone history which could contribute

## 2015-01-22 ENCOUNTER — Ambulatory Visit (INDEPENDENT_AMBULATORY_CARE_PROVIDER_SITE_OTHER)
Admission: RE | Admit: 2015-01-22 | Discharge: 2015-01-22 | Disposition: A | Discharge status: Home / Self Care | Payer: Medicare Other | Source: Ambulatory Visit | Attending: Family Medicine | Admitting: Family Medicine

## 2015-01-22 DIAGNOSIS — Z780 Asymptomatic menopausal state: Secondary | ICD-10-CM

## 2015-01-23 LAB — URINE CULTURE
Colony Count: NO GROWTH
Organism ID, Bacteria: NO GROWTH

## 2015-02-21 ENCOUNTER — Encounter: Payer: Self-pay | Admitting: Internal Medicine

## 2015-02-21 ENCOUNTER — Ambulatory Visit (INDEPENDENT_AMBULATORY_CARE_PROVIDER_SITE_OTHER): Payer: Medicare Other | Admitting: Family Medicine

## 2015-02-21 ENCOUNTER — Encounter: Payer: Self-pay | Admitting: Family Medicine

## 2015-02-21 VITALS — BP 138/70 | HR 71 | Temp 98.2°F | Wt 168.0 lb

## 2015-02-21 DIAGNOSIS — I10 Essential (primary) hypertension: Secondary | ICD-10-CM

## 2015-02-21 DIAGNOSIS — R748 Abnormal levels of other serum enzymes: Secondary | ICD-10-CM | POA: Diagnosis not present

## 2015-02-21 DIAGNOSIS — R7989 Other specified abnormal findings of blood chemistry: Secondary | ICD-10-CM

## 2015-02-21 LAB — MICROALBUMIN / CREATININE URINE RATIO
CREATININE, U: 94.5 mg/dL
Microalb Creat Ratio: 2.6 mg/g (ref 0.0–30.0)
Microalb, Ur: 2.5 mg/dL — ABNORMAL HIGH (ref 0.0–1.9)

## 2015-02-21 LAB — BASIC METABOLIC PANEL
BUN: 41 mg/dL — ABNORMAL HIGH (ref 6–23)
CO2: 27 meq/L (ref 19–32)
CREATININE: 1.81 mg/dL — AB (ref 0.40–1.20)
Calcium: 10 mg/dL (ref 8.4–10.5)
Chloride: 105 mEq/L (ref 96–112)
GFR: 29.61 mL/min — ABNORMAL LOW (ref >60.00)
Glucose, Bld: 87 mg/dL (ref 70–99)
POTASSIUM: 5 meq/L (ref 3.5–5.1)
Sodium: 138 mEq/L (ref 135–145)

## 2015-02-21 NOTE — Patient Instructions (Addendum)
Please call [facility] back to schedule colonoscopy [phone] (after we get things settled with your kidneys)  [facility] [place] Appt: 02/24/15 at 10:40  No voiding 1 hour before ultrasound   1. Get ultrasound at above date and time.  2. Take "water pill" lisinopril-hctz only once a day in morning for now. Hold night dose.  3. Get blood and urine today- we may make further changes but we will contact you to let you know based on labs.

## 2015-02-21 NOTE — Progress Notes (Signed)
[name], MD  Subjective:  Sara Rice is a 69 y.o. year old very pleasant female patient who presents with:  Subacute kidney injury/creatinine elevation- worsened at last visit. Hypertension- controlled -Ultrasound has not been completed- had to be rescheduled today. No UTI on culture last visit. Reduced lisinopril-hctz to once a day and increased amlodipine to 10mg  by phone but patient did not understand and while she did increase amlodipine, did not reduce lisinopril-hctz. Creatinine was up to 1.76 from 1.6 with baseline closer to 1 over the last year. Patient did work on remaining better hydrated.  ROS- no fever, chills, no CVA tenderness, no dysuria, no decreased urination  Past Medical History- CKD III at baseline, colon polyps, HTN, HLD  Medications- reviewed and updated Current Outpatient Prescriptions  Medication Sig Dispense Refill  . amLODipine (NORVASC) 10 MG tablet Take 1 tablet (10 mg total) by mouth daily. 30 tablet 5  . aspirin 81 MG tablet Take 81 mg by mouth daily.      . cholecalciferol (VITAMIN D) 1000 UNITS tablet Take 1,000 Units by mouth daily.      . Ferrous Sulfate (IRON) 325 (65 FE) MG TABS Take 1 tablet by mouth daily.    Marland Kitchen lisinopril-hydrochlorothiazide (PRINZIDE,ZESTORETIC) 20-12.5 MG per tablet Take 1 tablet by mouth daily. 60 tablet 5  . metoprolol succinate (TOPROL-XL) 50 MG 24 hr tablet Take 1 tablet (50 mg total) by mouth daily. 30 tablet 5   Objective: BP 138/70 mmHg  Pulse 71  Temp(Src) 98.2 F (36.8 C)  Wt 168 lb (76.204 kg) Gen: NAD, resting comfortably CV: RRR no murmurs rubs or gallops Lungs: CTAB no crackles, wheeze, rhonchi Abdomen: soft/nontender/nondistended/normal bowel sounds. No rebound or guarding.  No cva tenderness.  Ext: no edema Skin: warm, dry   Assessment/Plan:  Subacute kidney injury/creatinine elevation with baseline CKD III- worsened at last visit. Hypertension- controlled Reduce lisinopril-hctz to once a day  for now despite BP controlled current regimen. Continue other BP meds- amlodipine and metoprolol. Pending BMET- may stop completely (if Cr increased, stop completely, stable or reduced- continue once daily with repeat BMET 2 weeks).  Get u/s renal as previously planned. Get microalb/cr ratio. May need nephrology referral. HYdration has increased so hopeful this has helped  F/u planned by phone  Orders Placed This Encounter  Procedures  . Basic metabolic panel    [place]  . Microalbumin / creatinine urine ratio    

## 2015-02-22 NOTE — Addendum Note (Signed)
Addended by: Clyde Lundborg A on: 02/22/2015 09:11 AM   Modules accepted: Orders

## 2015-02-24 ENCOUNTER — Ambulatory Visit
Admission: RE | Admit: 2015-02-24 | Discharge: 2015-02-24 | Disposition: A | Discharge status: Home / Self Care | Payer: Medicare Other | Source: Ambulatory Visit | Attending: Family Medicine | Admitting: Family Medicine

## 2015-02-24 DIAGNOSIS — N183 Chronic kidney disease, stage 3 unspecified: Secondary | ICD-10-CM

## 2015-02-24 DIAGNOSIS — N281 Cyst of kidney, acquired: Secondary | ICD-10-CM | POA: Diagnosis not present

## 2015-02-24 DIAGNOSIS — R7989 Other specified abnormal findings of blood chemistry: Secondary | ICD-10-CM

## 2015-02-27 ENCOUNTER — Telehealth: Payer: Self-pay | Admitting: Family Medicine

## 2015-02-27 NOTE — Telephone Encounter (Signed)
Pt said she received a message that her results was release on my chart. Pt does not have a mychart because she does not have a computer. She need a call back about her [place] results after 3 PM any day

## 2015-02-28 NOTE — Telephone Encounter (Signed)
Tried calling pt again and lm for her tcb.

## 2015-03-03 NOTE — Telephone Encounter (Signed)
Pt returned call and will call back to schedule follow up appointment

## 2015-03-04 ENCOUNTER — Telehealth: Payer: Self-pay | Admitting: Family Medicine

## 2015-03-04 NOTE — Telephone Encounter (Signed)
Pt has been sch

## 2015-03-04 NOTE — Telephone Encounter (Signed)
Pt is calling back to make follow up appt on ultrasound. [facility] has sda slot. Can I use one? Pt requesting  morning appt due to transportation.

## 2015-03-04 NOTE — Telephone Encounter (Signed)
Sda is fine

## 2015-03-21 ENCOUNTER — Ambulatory Visit (INDEPENDENT_AMBULATORY_CARE_PROVIDER_SITE_OTHER): Payer: Medicare Other | Admitting: Family Medicine

## 2015-03-21 ENCOUNTER — Encounter: Payer: Self-pay | Admitting: Family Medicine

## 2015-03-21 VITALS — BP 144/74 | HR 76 | Temp 98.7°F | Wt 169.0 lb

## 2015-03-21 DIAGNOSIS — R7989 Other specified abnormal findings of blood chemistry: Secondary | ICD-10-CM

## 2015-03-21 DIAGNOSIS — R748 Abnormal levels of other serum enzymes: Secondary | ICD-10-CM

## 2015-03-21 DIAGNOSIS — I10 Essential (primary) hypertension: Secondary | ICD-10-CM | POA: Diagnosis not present

## 2015-03-21 DIAGNOSIS — N183 Chronic kidney disease, stage 3 unspecified: Secondary | ICD-10-CM

## 2015-03-21 LAB — BASIC METABOLIC PANEL
BUN: 25 mg/dL — AB (ref 6–23)
CO2: 27 meq/L (ref 19–32)
CREATININE: 1.36 mg/dL — AB (ref 0.40–1.20)
Calcium: 9.9 mg/dL (ref 8.4–10.5)
Chloride: 106 mEq/L (ref 96–112)
GFR: 41.17 mL/min — ABNORMAL LOW (ref >60.00)
Glucose, Bld: 84 mg/dL (ref 70–99)
Potassium: 4.3 mEq/L (ref 3.5–5.1)
Sodium: 140 mEq/L (ref 135–145)

## 2015-03-21 NOTE — Progress Notes (Signed)
[name], MD  Subjective:  Sara Rice is a 69 y.o. year old very pleasant female patient who presents with:  See problem oriented charting ROS- no chest pain, shortness of breath, no dizziness or blurry vision  Past Medical History- hyperlipidemia, colonic polyps  Medications- reviewed and updated Current Outpatient Prescriptions  Medication Sig Dispense Refill  . amLODipine (NORVASC) 10 MG tablet Take 1 tablet (10 mg total) by mouth daily. 30 tablet 5  . aspirin 81 MG tablet Take 81 mg by mouth daily.      . cholecalciferol (VITAMIN D) 1000 UNITS tablet Take 1,000 Units by mouth daily.      . Ferrous Sulfate (IRON) 325 (65 FE) MG TABS Take 1 tablet by mouth daily.    . metoprolol succinate (TOPROL-XL) 50 MG 24 hr tablet Take 1 tablet (50 mg total) by mouth daily. 30 tablet 5   Objective: BP 144/74 mmHg  Pulse 76  Temp(Src) 98.7 F (37.1 C)  Wt 169 lb (76.658 kg) Gen: NAD, resting comfortably CV: RRR no murmurs rubs or gallops Lungs: CTAB no crackles, wheeze, rhonchi Abdomen: soft/nontender/nondistended/normal bowel sounds. No rebound or guarding.  Ext: 1+ edema (no edema last visit when on hctz) Skin: warm, dry, no rash  Neuro: grossly normal, moves all extremities   Assessment/Plan:  Essential hypertension S: only mild poor control despite stopping lisinopril-hctz 20-12.5mg  BID for subactue kidney injury. HOme BP-110s to 120s at home on cuff (not verified).  BP Readings from Last 3 Encounters:  03/21/15 144/74  02/21/15 138/70  01/21/15 150/72  A/P: we are going to follow up next month and verify home cuff, if in fact 110-120s continue current dosing, does appear to have some HR room to go up on metoprolol- consider 100mg  next dose   CKD (chronic kidney disease), stage III S: stopped lisinopril-hctz completely last visit after 1/2 dose still showed modest rise in Cr from 1.76 to 1.81 A/P: Cr improved to 1.36 after this change. BP only moderately poor  controlled. We are going to see patient back in 1 month, if BP truly elevated consider add ace-i back alone with close follow up of GFR- tolerate 30% worsening max.   1 month BP/Cr check likely

## 2015-03-21 NOTE — Patient Instructions (Signed)
Medication Instructions:  Same, see list  Labwork: BMET before you go (ordered previously- can be released)  Testing/Procedures/Immunizations: Flu shot next visit  Follow-Up (all visit scheduling, rescheduling, cancellations including labs should be scheduled at front desk): See me in 4-5 weeks

## 2015-03-22 NOTE — Assessment & Plan Note (Signed)
S: only mild poor control despite stopping lisinopril-hctz 20-12.5mg  BID for subactue kidney injury. HOme BP-110s to 120s at home on cuff (not verified).  BP Readings from Last 3 Encounters:  03/21/15 144/74  02/21/15 138/70  01/21/15 150/72  A/P: we are going to follow up next month and verify home cuff, if in fact 110-120s continue current dosing, does appear to have some HR room to go up on metoprolol- consider 100mg  next dose

## 2015-03-22 NOTE — Assessment & Plan Note (Signed)
S: stopped lisinopril-hctz completely last visit after 1/2 dose still showed modest rise in Cr from 1.76 to 1.81 A/P: Cr improved to 1.36 after this change. BP only moderately poor controlled. We are going to see patient back in 1 month, if BP truly elevated consider add ace-i back alone with close follow up of GFR- tolerate 30% worsening max.

## 2015-04-29 ENCOUNTER — Ambulatory Visit (INDEPENDENT_AMBULATORY_CARE_PROVIDER_SITE_OTHER): Payer: Medicare Other | Admitting: Family Medicine

## 2015-04-29 ENCOUNTER — Encounter: Payer: Self-pay | Admitting: Family Medicine

## 2015-04-29 VITALS — BP 168/84 | HR 90 | Temp 98.5°F | Wt 172.0 lb

## 2015-04-29 DIAGNOSIS — N183 Chronic kidney disease, stage 3 unspecified: Secondary | ICD-10-CM

## 2015-04-29 DIAGNOSIS — I10 Essential (primary) hypertension: Secondary | ICD-10-CM | POA: Diagnosis not present

## 2015-04-29 DIAGNOSIS — Z23 Encounter for immunization: Secondary | ICD-10-CM | POA: Diagnosis not present

## 2015-04-29 LAB — BASIC METABOLIC PANEL WITH GFR
BUN: 24 mg/dL — ABNORMAL HIGH (ref 6–23)
CO2: 29 meq/L (ref 19–32)
Calcium: 9.7 mg/dL (ref 8.4–10.5)
Chloride: 106 meq/L (ref 96–112)
Creatinine, Ser: 1.24 mg/dL — ABNORMAL HIGH (ref 0.40–1.20)
GFR: 45.79 mL/min — ABNORMAL LOW (ref >60.00)
Glucose, Bld: 88 mg/dL (ref 70–99)
Potassium: 4.1 meq/L (ref 3.5–5.1)
Sodium: 140 meq/L (ref 135–145)

## 2015-04-29 MED ORDER — METOPROLOL SUCCINATE ER 100 MG PO TB24: 100.0000 mg | ORAL_TABLET | Freq: Every day | ORAL | Status: DC

## 2015-04-29 NOTE — Patient Instructions (Addendum)
Flu shot received today.  Contact [facility] regarding your colonoscopy [phone], once things are stabilized from blood pressure and kidney.  Blood pressure slightly higher today BP Readings from Last 3 Encounters:  04/29/15 168/84  03/21/15 144/74  02/21/15 138/70   Home numbers sound pretty good though. Let's have you increase metoprolol to 100mg - I sent this in.   Bring your cuff and your blood pressure log with you to next visit  Check in on kidney function before you leave

## 2015-04-29 NOTE — Assessment & Plan Note (Signed)
S: Last cr back down <1.4 after going to 1.8 nearly on 2 occassions. This was with stopping lisinopril-hctz completely.  A/P: check GFR/Cr today. At next visit if Bp still high, considering adding ace-i alone as long as increase in Cr <30%

## 2015-04-29 NOTE — Progress Notes (Signed)
[name], MD  Subjective:  Sara Rice is a 69 y.o. year old very pleasant female patient who presents for/with See problem oriented charting ROS- no chest pain or shortness of breath. No headache or blurry vision.   Past Medical History-  Patient Active Problem List   Diagnosis Date Noted  . CKD (chronic kidney disease), stage III 01/21/2015    Priority: Medium  . COLONIC POLYPS 05/26/2009    Priority: Medium  . Hyperlipidemia 05/26/2009    Priority: Medium  . Essential hypertension 08/21/2007    Priority: Medium  . Carotid stenosis 06/18/2013    Priority: Low  . VITAMIN D DEFICIENCY 05/26/2009    Priority: Low  . Anemia 08/21/2007    Priority: Low  . RENAL CALCULUS 08/21/2007    Priority: Low  . OSTEOARTHRITIS 08/21/2007    Priority: Low    Medications- reviewed and updated Current Outpatient Prescriptions  Medication Sig Dispense Refill  . amLODipine (NORVASC) 10 MG tablet Take 1 tablet (10 mg total) by mouth daily. 30 tablet 5  . aspirin 81 MG tablet Take 81 mg by mouth daily.      . cholecalciferol (VITAMIN D) 1000 UNITS tablet Take 1,000 Units by mouth daily.      . Ferrous Sulfate (IRON) 325 (65 FE) MG TABS Take 1 tablet by mouth daily.    . metoprolol succinate (TOPROL-XL) 100 MG 24 hr tablet Take 1 tablet (100 mg total) by mouth daily. 30 tablet 5   No current facility-administered medications for this visit.    Objective: BP 168/84 mmHg  Pulse 90  Temp(Src) 98.5 F (36.9 C)  Wt 172 lb (78.019 kg) Gen: NAD, resting comfortably CV: RRR no murmurs rubs or gallops Lungs: CTAB no crackles, wheeze, rhonchi Abdomen: soft/nontender/nondistended/normal bowel sounds. No rebound or guarding. obese Ext:  1+ edema Skin: warm, dry Neuro: grossly normal, moves all extremities  Assessment/Plan:  Essential hypertension S: poorly controlled in office. Did not bring home cuff as planned. Home readings mainly 130s so controlled BP Readings from Last 3  Encounters:  04/29/15 168/84  03/21/15 144/74  02/21/15 138/70  A/P:Continue current meds:  Amlodipine 10mg . Increase metoprolol XL from 50 to 100mg . Follow up within a month and bring home #s. Considering adding back lisinopril   CKD (chronic kidney disease), stage III S: Last cr back down <1.4 after going to 1.8 nearly on 2 occassions. This was with stopping lisinopril-hctz completely.  A/P: check GFR/Cr today. At next visit if Bp still high, considering adding ace-i alone as long as increase in Cr <30%   Return precautions advised. 1 month  Orders Placed This Encounter  Procedures  . Flu Vaccine QUAD 36+ mos IM  . Basic metabolic panel    [place]    Meds ordered this encounter  Medications  . metoprolol succinate (TOPROL-XL) 100 MG 24 hr tablet    Sig: Take 1 tablet (100 mg total) by mouth daily.    Dispense:  30 tablet    Refill:  5

## 2015-04-29 NOTE — Assessment & Plan Note (Signed)
S: poorly controlled in office. Did not bring home cuff as planned. Home readings mainly 130s so controlled BP Readings from Last 3 Encounters:  04/29/15 168/84  03/21/15 144/74  02/21/15 138/70  A/P:Continue current meds:  Amlodipine 10mg . Increase metoprolol XL from 50 to 100mg . Follow up within a month and bring home #s. Considering adding back lisinopril

## 2015-05-20 ENCOUNTER — Other Ambulatory Visit: Payer: Self-pay

## 2015-05-20 DIAGNOSIS — Z1231 Encounter for screening mammogram for malignant neoplasm of breast: Secondary | ICD-10-CM

## 2015-06-02 ENCOUNTER — Encounter: Payer: Self-pay | Admitting: Family Medicine

## 2015-06-02 ENCOUNTER — Ambulatory Visit (INDEPENDENT_AMBULATORY_CARE_PROVIDER_SITE_OTHER): Payer: Medicare Other | Admitting: Family Medicine

## 2015-06-02 VITALS — BP 162/88 | HR 79 | Temp 98.1°F | Wt 171.0 lb

## 2015-06-02 DIAGNOSIS — H9193 Unspecified hearing loss, bilateral: Secondary | ICD-10-CM | POA: Diagnosis not present

## 2015-06-02 DIAGNOSIS — I10 Essential (primary) hypertension: Secondary | ICD-10-CM

## 2015-06-02 MED ORDER — LISINOPRIL 10 MG PO TABS: 10.0000 mg | ORAL_TABLET | Freq: Every day | ORAL | Status: DC

## 2015-06-02 NOTE — Patient Instructions (Signed)
Your home cuff is pretty accurate. Continue to bring it with you to each visit so we can get a couple more data points.   Pressure at home above 140- want to get all your #s under 140.   Add lisinopril 10mg  in the morning  Increase metoprolol to 100mg  at night  Follow up in 2-3 weeks, we will need to repeat the kidney function test to make sure that things remain stable with the restart.

## 2015-06-02 NOTE — Assessment & Plan Note (Signed)
S: poorly controlled on amlodipine 10mg , metoprolol 50mg  XL, plan had been 100mg  but pharmacy did not fill for unclear reasons. Keba called and corrected the issue. Home cuff verification- 162/88 me, 169/87 cuff. Reasonable to use home cuff readings. Patient reports Takes both pills at night. Morning 130s or 140s SBP. Afternoon 140s, at times into 160s.  BP Readings from Last 3 Encounters:  06/02/15 162/88  04/29/15 168/84  03/21/15 144/74  A/P: increase metoprolol to 100 from 50mg  XL. Add lisinopril 10mg  back (follow upin 2-3 weeks with repeat creatinine to make sure low concern renal artery stenosis). Continue amlodipine 10mg .

## 2015-06-02 NOTE — Progress Notes (Signed)
[name], MD  Subjective:  Sara Rice is a 69 y.o. year old very pleasant female patient who presents for/with See problem oriented charting ROS- No chest pain or shortness of breath. No headache or blurry vision.   Past Medical History-  Patient Active Problem List   Diagnosis Date Noted  . CKD (chronic kidney disease), stage III 01/21/2015    Priority: Medium  . COLONIC POLYPS 05/26/2009    Priority: Medium  . Hyperlipidemia 05/26/2009    Priority: Medium  . Essential hypertension 08/21/2007    Priority: Medium  . Carotid stenosis 06/18/2013    Priority: Low  . VITAMIN D DEFICIENCY 05/26/2009    Priority: Low  . Anemia 08/21/2007    Priority: Low  . RENAL CALCULUS 08/21/2007    Priority: Low  . OSTEOARTHRITIS 08/21/2007    Priority: Low    Medications- reviewed and updated Current Outpatient Prescriptions  Medication Sig Dispense Refill  . amLODipine (NORVASC) 10 MG tablet Take 1 tablet (10 mg total) by mouth daily. 30 tablet 5  . aspirin 81 MG tablet Take 81 mg by mouth daily.      . cholecalciferol (VITAMIN D) 1000 UNITS tablet Take 1,000 Units by mouth daily.      . Ferrous Sulfate (IRON) 325 (65 FE) MG TABS Take 1 tablet by mouth daily.    . metoprolol succinate (TOPROL-XL) 100 MG 24 hr tablet Take 1 tablet (100 mg total) by mouth daily. 30 tablet 5  But was only being given 50mg  XL metoprolol   Objective: BP 162/88 mmHg  Pulse 79  Temp(Src) 98.1 F (36.7 C)  Wt 171 lb (77.565 kg) Gen: NAD, resting comfortably CV: RRR no murmurs rubs or gallops Lungs: CTAB no crackles, wheeze, rhonchi Abdomen: soft/nontender/nondistended/normal bowel sounds. No rebound or guarding.  Ext: trace edema Skin: warm, dry Neuro: grossly normal, moves all extremities  Assessment/Plan:  Essential hypertension S: poorly controlled on amlodipine 10mg , metoprolol 50mg  XL, plan had been 100mg  but pharmacy did not fill for unclear reasons. Keba called and corrected the  issue. Home cuff verification- 162/88 me, 169/87 cuff. Reasonable to use home cuff readings. Patient reports Takes both pills at night. Morning 130s or 140s SBP. Afternoon 140s, at times into 160s.  BP Readings from Last 3 Encounters:  06/02/15 162/88  04/29/15 168/84  03/21/15 144/74  A/P: increase metoprolol to 100 from 50mg  XL. Add lisinopril 10mg  back (follow upin 2-3 weeks with repeat creatinine to make sure low concern renal artery stenosis). Continue amlodipine 10mg .   Bilateral hearing loss S:Hearing loss at least over 6 months but likely far longer. Son has noted, patient also admits an issue. Symptoms largely stable A/P: refer to audiology. They may also check into [facility] Return precautions advised.   Orders Placed This Encounter  Procedures  . Ambulatory referral to Audiology    Referral Priority:  Routine    Referral Type:  Audiology Exam    Referral Reason:  Specialty Services Required    Number of Visits Requested:  1    Meds ordered this encounter  Medications  . lisinopril (PRINIVIL,ZESTRIL) 10 MG tablet    Sig: Take 1 tablet (10 mg total) by mouth daily.    Dispense:  90 tablet    Refill:  3

## 2015-06-24 ENCOUNTER — Ambulatory Visit
Admission: RE | Admit: 2015-06-24 | Discharge: 2015-06-24 | Disposition: A | Discharge status: Home / Self Care | Payer: Medicare Other | Source: Ambulatory Visit

## 2015-06-24 DIAGNOSIS — Z1231 Encounter for screening mammogram for malignant neoplasm of breast: Secondary | ICD-10-CM | POA: Diagnosis not present

## 2015-07-17 ENCOUNTER — Encounter: Payer: Self-pay | Admitting: Family Medicine

## 2015-07-17 ENCOUNTER — Ambulatory Visit (INDEPENDENT_AMBULATORY_CARE_PROVIDER_SITE_OTHER): Payer: Medicare Other | Admitting: Family Medicine

## 2015-07-17 VITALS — BP 162/80 | HR 77 | Temp 98.7°F | Wt 173.0 lb

## 2015-07-17 DIAGNOSIS — N183 Chronic kidney disease, stage 3 unspecified: Secondary | ICD-10-CM

## 2015-07-17 DIAGNOSIS — I10 Essential (primary) hypertension: Secondary | ICD-10-CM | POA: Diagnosis not present

## 2015-07-17 LAB — BASIC METABOLIC PANEL
BUN: 20 mg/dL (ref 6–23)
CALCIUM: 10.1 mg/dL (ref 8.4–10.5)
CO2: 32 mEq/L (ref 19–32)
Chloride: 105 mEq/L (ref 96–112)
Creatinine, Ser: 1.24 mg/dL — ABNORMAL HIGH (ref 0.40–1.20)
GFR: 45.76 mL/min — AB (ref >60.00)
Glucose, Bld: 84 mg/dL (ref 70–99)
Potassium: 4.6 mEq/L (ref 3.5–5.1)
Sodium: 140 mEq/L (ref 135–145)

## 2015-07-17 NOTE — Patient Instructions (Signed)
Blood pressure looks great at home. Continue to check at least every other day and continue to bring your log with you when you come in.   Make sure kidney function looks ok before you go.   See you back in about 4 months (graduating you since you have been doing so well)

## 2015-07-17 NOTE — Assessment & Plan Note (Signed)
S: controlled on Amlodipine 10mg , metoprolol 100mg  24 hour, lisinopril 10mg . Home readings brings in 85 readings since last visit with me with only 15% of these readings above 140/90 and most of these readings are in the low 140s. Highest reading was 152 and one time occasion.  BP Readings from Last 3 Encounters:  07/17/15 162/80. Home cuff reads 163/83 after  06/02/15 162/88  04/29/15 168/84  A/P:Continue current meds: Home cuff once again verifies and home readings vast majority controlled. Continue.  Amlodipine 10mg , metoprolol 100mg  24 hour, lisinopril 10mg . Make sure <20% increase in Creatinine with adding back in lisinopril

## 2015-07-17 NOTE — Assessment & Plan Note (Signed)
S: GFR improved off lisinopril-HCTZ then added lisinopril back in last visit A/P: check GFR/Creatinine today- hopeful for stability and continue ace-i.

## 2015-07-17 NOTE — Progress Notes (Signed)
[name], MD  Subjective:  Sara Rice is a 69 y.o. year old very pleasant female patient who presents for/with See problem oriented charting ROS- No chest pain or shortness of breath. No headache or blurry vision. No change in urination pattern  Past Medical History-  Patient Active Problem List   Diagnosis Date Noted  . CKD (chronic kidney disease), stage III 01/21/2015    Priority: Medium  . COLONIC POLYPS 05/26/2009    Priority: Medium  . Hyperlipidemia 05/26/2009    Priority: Medium  . Essential hypertension 08/21/2007    Priority: Medium  . Carotid stenosis 06/18/2013    Priority: Low  . VITAMIN D DEFICIENCY 05/26/2009    Priority: Low  . Anemia 08/21/2007    Priority: Low  . RENAL CALCULUS 08/21/2007    Priority: Low  . OSTEOARTHRITIS 08/21/2007    Priority: Low    Medications- reviewed and updated Current Outpatient Prescriptions  Medication Sig Dispense Refill  . amLODipine (NORVASC) 10 MG tablet Take 1 tablet (10 mg total) by mouth daily. 30 tablet 5  . aspirin 81 MG tablet Take 81 mg by mouth daily.      . cholecalciferol (VITAMIN D) 1000 UNITS tablet Take 1,000 Units by mouth daily.      . Ferrous Sulfate (IRON) 325 (65 FE) MG TABS Take 1 tablet by mouth daily.    Marland Kitchen lisinopril (PRINIVIL,ZESTRIL) 10 MG tablet Take 1 tablet (10 mg total) by mouth daily. 90 tablet 3  . metoprolol succinate (TOPROL-XL) 100 MG 24 hr tablet Take 1 tablet (100 mg total) by mouth daily. 30 tablet 5   No current facility-administered medications for this visit.    Objective: BP 162/80 mmHg  Pulse 77  Temp(Src) 98.7 F (37.1 C)  Wt 173 lb (78.472 kg) Gen: NAD, resting comfortably CV: RRR no murmurs rubs or gallops Lungs: CTAB no crackles, wheeze, rhonchi Abdomen: soft/nontender/nondistended/normal bowel sounds. No rebound or guarding. obese Ext: no edema Skin: warm, dry Neuro: grossly normal, moves all extremities   Assessment/Plan:  Essential hypertension S:  controlled on Amlodipine 10mg , metoprolol 100mg  24 hour, lisinopril 10mg . Home readings brings in 85 readings since last visit with me with only 15% of these readings above 140/90 and most of these readings are in the low 140s. Highest reading was 152 and one time occasion.  BP Readings from Last 3 Encounters:  07/17/15 162/80. Home cuff reads 163/83 after  06/02/15 162/88  04/29/15 168/84  A/P:Continue current meds: Home cuff once again verifies and home readings vast majority controlled. Continue.  Amlodipine 10mg , metoprolol 100mg  24 hour, lisinopril 10mg . Make sure <20% increase in Creatinine with adding back in lisinopril   CKD (chronic kidney disease), stage III S: GFR improved off lisinopril-HCTZ then added lisinopril back in last visit A/P: check GFR/Creatinine today- hopeful for stability and continue ace-i.   4 months as doing very well Return precautions advised.   Orders Placed This Encounter  Procedures  . Basic metabolic panel    [place]

## 2015-08-05 ENCOUNTER — Encounter: Payer: Self-pay | Admitting: Internal Medicine

## 2015-08-06 ENCOUNTER — Other Ambulatory Visit: Payer: Self-pay | Admitting: Family Medicine

## 2015-09-15 ENCOUNTER — Ambulatory Visit (AMBULATORY_SURGERY_CENTER): Payer: Self-pay

## 2015-09-15 VITALS — Ht 62.0 in | Wt 181.0 lb

## 2015-09-15 DIAGNOSIS — Z860100 Personal history of colon polyps, unspecified: Secondary | ICD-10-CM

## 2015-09-15 MED ORDER — SUPREP BOWEL PREP KIT 17.5-3.13-1.6 GM/177ML PO SOLN: 1.0000 | Freq: Once | ORAL | Status: DC

## 2015-09-15 NOTE — Progress Notes (Signed)
No allergies to eggs or soy No past problems with anesthesia No home oxygen No diet/weight loss meds  Has email and internet; registered for emmi 

## 2015-09-22 ENCOUNTER — Encounter: Payer: Self-pay | Admitting: *Deleted

## 2015-09-22 ENCOUNTER — Telehealth: Payer: Self-pay | Admitting: Internal Medicine

## 2015-09-22 NOTE — Telephone Encounter (Signed)
Returned pt's call about prep being to expensive. Pt said last time she took Miralax. Advd pt will change instructions to Miralax and will mail instructions today. Also adv pt items to purchase (Miralax, gatorade, and Dulcolax laxatives) and when to take prep. Monaye Blackie-PV

## 2015-09-29 ENCOUNTER — Ambulatory Visit (AMBULATORY_SURGERY_CENTER): Payer: Medicare Other | Admitting: Internal Medicine

## 2015-09-29 ENCOUNTER — Encounter: Payer: Self-pay | Admitting: Internal Medicine

## 2015-09-29 VITALS — BP 122/66 | HR 74 | Resp 18 | Ht 62.0 in | Wt 181.0 lb

## 2015-09-29 DIAGNOSIS — I10 Essential (primary) hypertension: Secondary | ICD-10-CM | POA: Diagnosis not present

## 2015-09-29 DIAGNOSIS — Z86010 Personal history of colonic polyps: Secondary | ICD-10-CM | POA: Diagnosis not present

## 2015-09-29 MED ORDER — SODIUM CHLORIDE 0.9 % IV SOLN: 500.0000 mL | INTRAVENOUS | Status: DC

## 2015-09-29 NOTE — Op Note (Signed)
[place]  [address]. [place], [address]   COLONOSCOPY PROCEDURE REPORT  PATIENT: Sara Rice, Sara Rice  MR#: [id number] BIRTHDATE: [birth year] , 24  yrs. old GENDER: female ENDOSCOPIST: Gatha Mayer, MD, [facility] PROCEDURE DATE:  09/29/2015 PROCEDURE:   Colonoscopy, surveillance First Screening Colonoscopy - Avg.  risk and is 50 yrs.  old or older - No.  Prior Negative Screening - Now for repeat screening. N/A  History of Adenoma - Now for follow-up colonoscopy & has been > or = to 3 yrs.  Yes hx of adenoma.  Has been 3 or more years since last colonoscopy.  Polyps removed today? No Recommend repeat exam, <10 yrs? Yes high risk ASA CLASS:   Class III INDICATIONS:Surveillance due to prior colonic neoplasia and PH Colon Adenoma. MEDICATIONS: Propofol 200 mg IV and Monitored anesthesia care  DESCRIPTION OF PROCEDURE:   After the risks benefits and alternatives of the procedure were thoroughly explained, informed consent was obtained.  The digital rectal exam revealed no abnormalities of the rectum.   The LB [id number]  endoscope was introduced through the anus and advanced to the cecum, which was identified by both the appendix and ileocecal valve. No adverse events experienced.   The quality of the prep was excellent. (MiraLax was used)  The instrument was then slowly withdrawn as the colon was fully examined. Estimated blood loss is zero unless otherwise noted in this procedure report.      COLON FINDINGS: There was mild diverticulosis noted in the left colon.   The examination was otherwise normal.  Retroflexed views revealed no abnormalities. The time to cecum = 4.0 Withdrawal time = 9.3   The scope was withdrawn and the procedure completed. COMPLICATIONS: There were no immediate complications.  ENDOSCOPIC IMPRESSION: 1.   There was mild diverticulosis noted in the left colon 2.   The examination was otherwise normal - excellent  prep  RECOMMENDATIONS: Repeat colonoscopy in 5 years.  2022 - hx 15 mm sessile TV adenoma 2010  eSigned:  Gatha Mayer, MD, [facility] 09/29/2015 11:32 AM   cc: The Patient and Dr. [name]

## 2015-09-29 NOTE — Progress Notes (Signed)
Report to PACU, RN, vss, BBS= Clear.  

## 2015-09-29 NOTE — Patient Instructions (Addendum)
No polyps today. You do have diverticulosis - thickened muscle rings and pouches in the colon wall. Please read the handout about this condition.  Your next routine colonoscopy should be in 5 years - 2022.  I appreciate the opportunity to care for you. Gatha Mayer, MD, FACG  YOU HAD AN ENDOSCOPIC PROCEDURE TODAY AT [facility]:   Refer to the procedure report that was given to you for any specific questions about what was found during the examination.  If the procedure report does not answer your questions, please call your gastroenterologist to clarify.  If you requested that your care partner not be given the details of your procedure findings, then the procedure report has been included in a sealed envelope for you to review at your convenience later.  YOU SHOULD EXPECT: Some feelings of bloating in the abdomen. Passage of more gas than usual.  Walking can help get rid of the air that was put into your GI tract during the procedure and reduce the bloating. If you had a lower endoscopy (such as a colonoscopy or flexible sigmoidoscopy) you may notice spotting of blood in your stool or on the toilet paper. If you underwent a bowel prep for your procedure, you may not have a normal bowel movement for a few days.  Please Note:  You might notice some irritation and congestion in your nose or some drainage.  This is from the oxygen used during your procedure.  There is no need for concern and it should clear up in a day or so.  SYMPTOMS TO REPORT IMMEDIATELY:   Following lower endoscopy (colonoscopy or flexible sigmoidoscopy):  Excessive amounts of blood in the stool  Significant tenderness or worsening of abdominal pains  Swelling of the abdomen that is new, acute  Fever of 100F or higher  For urgent or emergent issues, a gastroenterologist can be reached at any hour by calling [phone].   DIET: Your first meal following the procedure should be a small meal  and then it is ok to progress to your normal diet. Heavy or fried foods are harder to digest and may make you feel nauseous or bloated.  Likewise, meals heavy in dairy and vegetables can increase bloating.  Drink plenty of fluids but you should avoid alcoholic beverages for 24 hours.  ACTIVITY:  You should plan to take it easy for the rest of today and you should NOT DRIVE or use heavy machinery until tomorrow (because of the sedation medicines used during the test).    FOLLOW UP: Our staff will call the number listed on your records the next business day following your procedure to check on you and address any questions or concerns that you may have regarding the information given to you following your procedure. If we do not reach you, we will leave a message.  However, if you are feeling well and you are not experiencing any problems, there is no need to return our call.  We will assume that you have returned to your regular daily activities without incident.  If any biopsies were taken you will be contacted by phone or by letter within the next 1-3 weeks.  Please call us at [phone] if you have not heard about the biopsies in 3 weeks.    SIGNATURES/CONFIDENTIALITY: You and/or your care partner have signed paperwork which will be entered into your electronic medical record.  These signatures attest to the fact that that the information above on  your After Visit Summary has been reviewed and is understood.  Full responsibility of the confidentiality of this discharge information lies with you and/or your care-partner.  Diverticulosis-handout given  Repeat colonoscopy in 5 years 2022.

## 2015-09-30 ENCOUNTER — Telehealth: Payer: Self-pay | Admitting: *Deleted

## 2015-09-30 NOTE — Telephone Encounter (Signed)
  Follow up Call-  Call back number 09/29/2015  Post procedure Call Back phone  # [phone]  Permission to leave phone message Yes     Patient questions:  Do you have a fever, pain , or abdominal swelling? No. Pain Score  0 *  Have you tolerated food without any problems? Yes.    Have you been able to return to your normal activities? Yes.    Do you have any questions about your discharge instructions: Diet   No. Medications  No. Follow up visit  No.  Do you have questions or concerns about your Care? No.  Actions: * If pain score is 4 or above: No action needed, pain <4.

## 2015-11-14 ENCOUNTER — Ambulatory Visit: Payer: Medicare Other | Admitting: Family Medicine

## 2015-11-25 ENCOUNTER — Ambulatory Visit (INDEPENDENT_AMBULATORY_CARE_PROVIDER_SITE_OTHER): Payer: Medicare Other | Admitting: Family Medicine

## 2015-11-25 ENCOUNTER — Encounter: Payer: Self-pay | Admitting: Family Medicine

## 2015-11-25 VITALS — BP 150/70 | HR 78 | Temp 98.5°F | Wt 180.0 lb

## 2015-11-25 DIAGNOSIS — I10 Essential (primary) hypertension: Secondary | ICD-10-CM | POA: Diagnosis not present

## 2015-11-25 DIAGNOSIS — N183 Chronic kidney disease, stage 3 unspecified: Secondary | ICD-10-CM

## 2015-11-25 DIAGNOSIS — E785 Hyperlipidemia, unspecified: Secondary | ICD-10-CM | POA: Diagnosis not present

## 2015-11-25 DIAGNOSIS — Z23 Encounter for immunization: Secondary | ICD-10-CM | POA: Diagnosis not present

## 2015-11-25 LAB — BASIC METABOLIC PANEL
BUN: 25 mg/dL — ABNORMAL HIGH (ref 6–23)
CALCIUM: 10.3 mg/dL (ref 8.4–10.5)
CO2: 29 meq/L (ref 19–32)
Chloride: 105 mEq/L (ref 96–112)
Creatinine, Ser: 1.39 mg/dL — ABNORMAL HIGH (ref 0.40–1.20)
GFR: 40.07 mL/min — ABNORMAL LOW (ref >60.00)
GLUCOSE: 93 mg/dL (ref 70–99)
Potassium: 4.6 mEq/L (ref 3.5–5.1)
SODIUM: 140 meq/L (ref 135–145)

## 2015-11-25 MED ORDER — METOPROLOL SUCCINATE ER 100 MG PO TB24: 100.0000 mg | ORAL_TABLET | Freq: Every day | ORAL | Status: DC

## 2015-11-25 MED ORDER — LISINOPRIL 10 MG PO TABS: 10.0000 mg | ORAL_TABLET | Freq: Every day | ORAL | Status: DC

## 2015-11-25 MED ORDER — AMLODIPINE BESYLATE 10 MG PO TABS: 10.0000 mg | ORAL_TABLET | Freq: Every day | ORAL | Status: DC

## 2015-11-25 NOTE — Assessment & Plan Note (Addendum)
S: GFR had dipped into low 30s, high 20s back 9-10 months ago. She was taken off HCTZ and improved to mid 40s with creatinine down from 1.8 to 1.24.  A/P: will update BMET today and before awv, expect to be stable

## 2015-11-25 NOTE — Assessment & Plan Note (Signed)
S: mild poorly controlled on no statin. No myalgias.  Lab Results  Component Value Date   CHOL 185 08/13/2014   HDL 30.50* 08/13/2014   LDLCALC 130* 08/13/2014   TRIG 124.0 08/13/2014   CHOLHDL 6 08/13/2014   A/P: we will update lipids before awv and calculate 10 year risk per ascvd risk estimator

## 2015-11-25 NOTE — Progress Notes (Signed)
Subjective:  Sara Rice is a 70 y.o. year old very pleasant female patient who presents for/with See problem oriented charting ROS- mild ankle swelling worse if on feet long time. No chest pain or shortness of breath. No headache or blurry vision.   Past Medical History-  Patient Active Problem List   Diagnosis Date Noted  . CKD (chronic kidney disease), stage III 01/21/2015    Priority: Medium  . Hyperlipidemia 05/26/2009    Priority: Medium  . Essential hypertension 08/21/2007    Priority: Medium  . Carotid stenosis 06/18/2013    Priority: Low  . VITAMIN D DEFICIENCY 05/26/2009    Priority: Low  . Personal history of colonic polyps 03/21/2009    Priority: Low  . Anemia 08/21/2007    Priority: Low  . RENAL CALCULUS 08/21/2007    Priority: Low  . OSTEOARTHRITIS 08/21/2007    Priority: Low    Medications- reviewed and updated Current Outpatient Prescriptions  Medication Sig Dispense Refill  . amLODipine (NORVASC) 10 MG tablet Take 1 tablet (10 mg total) by mouth daily. 90 tablet 3  . aspirin 81 MG tablet Take 81 mg by mouth daily.      . cholecalciferol (VITAMIN D) 1000 UNITS tablet Take 1,000 Units by mouth daily.      . Ferrous Sulfate (IRON) 325 (65 FE) MG TABS Take 1 tablet by mouth daily.    Marland Kitchen lisinopril (PRINIVIL,ZESTRIL) 10 MG tablet Take 1 tablet (10 mg total) by mouth daily. 90 tablet 3  . metoprolol succinate (TOPROL-XL) 100 MG 24 hr tablet Take 1 tablet (100 mg total) by mouth daily. 90 tablet 3   No current facility-administered medications for this visit.    Objective: BP 150/70 mmHg  Pulse 78  Temp(Src) 98.5 F (36.9 C)  Wt 180 lb (81.647 kg) Gen: NAD, resting comfortably Mucous membranes are moist. CV: RRR no murmurs rubs or gallops Lungs: CTAB no crackles, wheeze, rhonchi Abdomen: soft/nontender/nondistended/normal bowel sounds. No rebound or guarding.  Ext: trace edema Skin: warm, dry Neuro: grossly normal, moves all  extremities  Assessment/Plan:  Essential hypertension S: controlled previously on amlodipine 10mg  and lisinopril 10mg  and metoprolol 100mg  XL. 77 home readings brought in and only 3 above 140/90 and 2 of these are in low 140s. Home cuff accurate again today BP Readings from Last 3 Encounters:  11/25/15 150/70  09/29/15 122/66  07/17/15 162/80  A/P:Continue current meds:  No changes due to excellent home control and verified readings.   Hyperlipidemia S: mild poorly controlled on no statin. No myalgias.  Lab Results  Component Value Date   CHOL 185 08/13/2014   HDL 30.50* 08/13/2014   LDLCALC 130* 08/13/2014   TRIG 124.0 08/13/2014   CHOLHDL 6 08/13/2014   A/P: we will update lipids before awv and calculate 10 year risk per ascvd risk estimator  CKD (chronic kidney disease), stage III S: GFR had dipped into low 30s, high 20s back 9-10 months ago. She was taken off HCTZ and improved to mid 40s with creatinine down from 1.8 to 1.24.  A/P: will update BMET today and before awv, expect to be stable   Return in about 6 months (around 05/26/2016) for annual wellness visit. Return precautions advised.   Orders Placed This Encounter  Procedures  . Pneumococcal polysaccharide vaccine 23-valent greater than or equal to 2yo subcutaneous/IM  . Basic metabolic panel    [place]  . CBC    Standing Status: Future     Number of Occurrences:  Standing Expiration Date: 11/24/2016  . Comprehensive metabolic panel    [place]    Standing Status: Future     Number of Occurrences:      Standing Expiration Date: 11/24/2016  . Lipid panel    Standing Status: Future     Number of Occurrences:      Standing Expiration Date: 11/24/2016    Meds ordered this encounter  Medications  . metoprolol succinate (TOPROL-XL) 100 MG 24 hr tablet    Sig: Take 1 tablet (100 mg total) by mouth daily.    Dispense:  90 tablet    Refill:  3  . lisinopril (PRINIVIL,ZESTRIL) 10 MG tablet    Sig: Take 1  tablet (10 mg total) by mouth daily.    Dispense:  90 tablet    Refill:  3  . amLODipine (NORVASC) 10 MG tablet    Sig: Take 1 tablet (10 mg total) by mouth daily.    Dispense:  90 tablet    Refill:  3    [name], MD

## 2015-11-25 NOTE — Patient Instructions (Addendum)
Pneumovax23 received today.  No changes to medications  Please stop by lab on your way out.   Schedule a lab visit at the check out desk for a few days before your annual wellness visit. Return for future fasting labs at that time. Nothing but water after midnight please at that time.   Check with your insurance to see if they cover shingles shot in office and we can give you next time if they do- they may want you to get it at a pharmacy though so ask them about that option

## 2015-11-25 NOTE — Assessment & Plan Note (Signed)
S: controlled previously on amlodipine 10mg  and lisinopril 10mg  and metoprolol 100mg  XL. 77 home readings brought in and only 3 above 140/90 and 2 of these are in low 140s. Home cuff accurate again today BP Readings from Last 3 Encounters:  11/25/15 150/70  09/29/15 122/66  07/17/15 162/80  A/P:Continue current meds:  No changes due to excellent home control and verified readings.

## 2015-12-15 ENCOUNTER — Other Ambulatory Visit: Payer: Self-pay

## 2016-04-01 ENCOUNTER — Other Ambulatory Visit: Payer: Self-pay

## 2016-05-17 ENCOUNTER — Other Ambulatory Visit: Payer: Self-pay | Admitting: Family Medicine

## 2016-05-17 DIAGNOSIS — Z1231 Encounter for screening mammogram for malignant neoplasm of breast: Secondary | ICD-10-CM

## 2016-05-19 ENCOUNTER — Other Ambulatory Visit (INDEPENDENT_AMBULATORY_CARE_PROVIDER_SITE_OTHER): Payer: Medicare Other

## 2016-05-19 DIAGNOSIS — E785 Hyperlipidemia, unspecified: Secondary | ICD-10-CM | POA: Diagnosis not present

## 2016-05-19 LAB — CBC
HCT: 33.7 % — ABNORMAL LOW (ref 36.0–46.0)
HEMOGLOBIN: 11.5 g/dL — AB (ref 12.0–15.0)
MCHC: 34.1 g/dL (ref 30.0–36.0)
MCV: 86.8 fl (ref 78.0–100.0)
PLATELETS: 366 10*3/uL (ref 150.0–400.0)
RBC: 3.89 Mil/uL (ref 3.87–5.11)
RDW: 13.7 % (ref 11.5–15.5)
WBC: 6.9 10*3/uL (ref 4.0–10.5)

## 2016-05-19 LAB — COMPREHENSIVE METABOLIC PANEL
ALBUMIN: 4.1 g/dL (ref 3.5–5.2)
ALK PHOS: 88 U/L (ref 39–117)
ALT: 19 U/L (ref 0–35)
AST: 18 U/L (ref 0–37)
BILIRUBIN TOTAL: 0.4 mg/dL (ref 0.2–1.2)
BUN: 24 mg/dL — ABNORMAL HIGH (ref 6–23)
CALCIUM: 10 mg/dL (ref 8.4–10.5)
CO2: 29 mEq/L (ref 19–32)
Chloride: 104 mEq/L (ref 96–112)
Creatinine, Ser: 1.53 mg/dL — ABNORMAL HIGH (ref 0.40–1.20)
GFR: 35.82 mL/min — AB (ref >60.00)
GLUCOSE: 93 mg/dL (ref 70–99)
Potassium: 4.8 mEq/L (ref 3.5–5.1)
Sodium: 139 mEq/L (ref 135–145)
TOTAL PROTEIN: 8.2 g/dL (ref 6.0–8.3)

## 2016-05-19 LAB — LIPID PANEL
Cholesterol: 181 mg/dL (ref 0–200)
HDL: 32.5 mg/dL — AB (ref >39.00)
LDL Cholesterol: 121 mg/dL — ABNORMAL HIGH (ref 0–99)
NONHDL: 148.72
Total CHOL/HDL Ratio: 6
Triglycerides: 139 mg/dL (ref 0.0–149.0)
VLDL: 27.8 mg/dL (ref 0.0–40.0)

## 2016-05-26 ENCOUNTER — Ambulatory Visit (INDEPENDENT_AMBULATORY_CARE_PROVIDER_SITE_OTHER): Payer: Medicare Other | Admitting: Family Medicine

## 2016-05-26 ENCOUNTER — Encounter: Payer: Self-pay | Admitting: Family Medicine

## 2016-05-26 VITALS — BP 158/78 | HR 73 | Temp 97.7°F | Ht 63.0 in | Wt 181.6 lb

## 2016-05-26 DIAGNOSIS — E785 Hyperlipidemia, unspecified: Secondary | ICD-10-CM

## 2016-05-26 DIAGNOSIS — I10 Essential (primary) hypertension: Secondary | ICD-10-CM

## 2016-05-26 DIAGNOSIS — Z23 Encounter for immunization: Secondary | ICD-10-CM | POA: Diagnosis not present

## 2016-05-26 DIAGNOSIS — Z0000 Encounter for general adult medical examination without abnormal findings: Secondary | ICD-10-CM

## 2016-05-26 DIAGNOSIS — I129 Hypertensive chronic kidney disease with stage 1 through stage 4 chronic kidney disease, or unspecified chronic kidney disease: Secondary | ICD-10-CM | POA: Diagnosis not present

## 2016-05-26 DIAGNOSIS — N183 Chronic kidney disease, stage 3 unspecified: Secondary | ICD-10-CM

## 2016-05-26 MED ORDER — TETANUS-DIPHTH-ACELL PERTUSSIS 5-2.5-18.5 LF-MCG/0.5 IM SUSP: 0.5000 mL | Freq: Once | INTRAMUSCULAR | 0 refills | Status: AC

## 2016-05-26 NOTE — Assessment & Plan Note (Signed)
S: mild poorly controlled on no rx. No myalgias.  Lab Results  Component Value Date   CHOL 181 05/19/2016   HDL 32.50 (L) 05/19/2016   LDLCALC 121 (H) 05/19/2016   TRIG 139.0 05/19/2016   CHOLHDL 6 05/19/2016   A/P: 10.9% 10 year risk - discussed need for weight loss, healthy eating. She wants to trial this without statin over next year as weight up over 10 lbs- if remains up next year, agrees to consider statin

## 2016-05-26 NOTE — Progress Notes (Signed)
Pre visit review using our clinic review tool, if applicable. No additional management support is needed unless otherwise documented below in the visit note. 

## 2016-05-26 NOTE — Assessment & Plan Note (Signed)
S: controlled on amlodipine 10mg , lisinopril 10mg , metoprolol 100mg  XR at home with home cuff verified in past and again today. White coat HTN in office. BP Readings from Last 3 Encounters:  05/26/16 (!) 158/78  11/25/15 (!) 150/70  09/29/15 122/66  A/P:Continue current meds:  Doing very well

## 2016-05-26 NOTE — Progress Notes (Signed)
Phone: [phone]  Subjective:  Patient presents today for their annual wellness visit.    Preventive Screening-Counseling & Management  Smoking Status: Never Smoker Second Hand Smoking status: No smokers in home  Risk Factors Regular exercise: walks once a week. Does some yardwork. Advised 150 minutes exercise per week Diet: working on low sodium. States weight gain is due to being less active as not working.   Fall Risk: None  Fall Risk  05/26/2016 01/21/2015 12/04/2012  Falls in the past year? No No No    Cardiac risk factors:  advanced age (older than 22 for men, 89 for women)  Hyperlipidemia - mild poor control see discussion below HTN- controlled at home, white coat element in office No diabetes.    Depression Screen None. PHQ2 0  Depression screen [facility] 2/9 05/26/2016 01/21/2015 12/04/2012  Decreased Interest 0 0 0  Down, Depressed, Hopeless 0 0 0  PHQ - 2 Score 0 0 0    Activities of Daily Living Independent ADLs and IADLs   Hearing Difficulties: -patient states mild issues- has plans to see audiologist- has #s at home  Cognitive Testing No reported trouble.   Normal 3 word recall  List the Names of Other Physician/Practitioners you currently use: -no eye exam in last year- considering reestablishing  Immunization History  Administered Date(s) Administered  . Influenza Split 05/31/2011, 05/29/2012  . Influenza Whole 06/18/2009, 05/02/2010  . Influenza,inj,Quad PF,36+ Mos 06/18/2013, 05/14/2014, 04/29/2015  . Pneumococcal Conjugate-13 06/25/2014  . Pneumococcal Polysaccharide-23 11/25/2015   Required Immunizations needed today: flu shot today  Screening tests- up to date Health Maintenance Due  Topic Date Due  . TETANUS/TDAP - send to pharmacy 11/01/1966  . ZOSTAVAX - $200 so declines 11/01/2007  . INFLUENZA VACCINE - today 03/02/2016    ROS- No pertinent positives discovered in course of AWV ROS- No chest pain or shortness of breath. No  headache or blurry vision.   The following were reviewed and entered/updated in epic: Past Medical History:  Diagnosis Date  . Anemia, unspecified   . Benign neoplasm of colon   . Calculus of kidney   . HEMORRHOIDS 05/26/2009  . Osteoarthrosis, unspecified whether generalized or localized, unspecified site   . Other and unspecified hyperlipidemia   . Other symptoms involving cardiovascular system   . Unspecified essential hypertension   . Unspecified hemorrhoids without mention of complication   . Unspecified vitamin D deficiency    Patient Active Problem List   Diagnosis Date Noted  . CKD (chronic kidney disease), stage III 01/21/2015    Priority: Medium  . Hyperlipidemia 05/26/2009    Priority: Medium  . Essential hypertension 08/21/2007    Priority: Medium  . Carotid stenosis 06/18/2013    Priority: Low  . VITAMIN D DEFICIENCY 05/26/2009    Priority: Low  . Personal history of colonic polyps 03/21/2009    Priority: Low  . Anemia 08/21/2007    Priority: Low  . RENAL CALCULUS 08/21/2007    Priority: Low  . OSTEOARTHRITIS 08/21/2007    Priority: Low   Past Surgical History:  Procedure Laterality Date  . COLONOSCOPY    . DENTAL SURGERY      Family History  Problem Relation Age of Onset  . Diabetes Mother   . Hypertension Mother   . Colon polyps Mother   . Stroke Brother     late 15s, nonsmoker  . Hypertension Brother   . Hypertension Sister     hx of obesity  . Colon cancer Neg  Hx     Medications- reviewed and updated Current Outpatient Prescriptions  Medication Sig Dispense Refill  . amLODipine (NORVASC) 10 MG tablet Take 1 tablet (10 mg total) by mouth daily. 90 tablet 3  . aspirin 81 MG tablet Take 81 mg by mouth daily.      . cholecalciferol (VITAMIN D) 1000 UNITS tablet Take 1,000 Units by mouth daily.      . Ferrous Sulfate (IRON) 325 (65 FE) MG TABS Take 1 tablet by mouth daily.    Marland Kitchen lisinopril (PRINIVIL,ZESTRIL) 10 MG tablet Take 1 tablet (10 mg  total) by mouth daily. 90 tablet 3  . metoprolol succinate (TOPROL-XL) 100 MG 24 hr tablet Take 1 tablet (100 mg total) by mouth daily. 90 tablet 3  . Tdap (BOOSTRIX) 5-2.5-18.5 LF-MCG/0.5 injection Inject 0.5 mLs into the muscle once. 0.5 mL 0   No current facility-administered medications for this visit.     Allergies-reviewed and updated No Known Allergies  Social History   Social History  . Marital status: Legally Separated    Spouse name: N/A  . Number of children: 2  . Years of education: N/A   Occupational History  . [employer]   .  [employer]   Social History Main Topics  . Smoking status: Never Smoker  . Smokeless tobacco: Never Used  . Alcohol use No  . Drug use: No  . Sexual activity: Not Asked   Other Topics Concern  . None   Social History Narrative   Widowed in 2012 (rectal cancer-they were not together at that time). 2 kids. 1 grandson.       Retired from [employer] then returned to work 4 hours per day      Hobbies: shopping, reading, relaxing, gardening    Objective: BP (!) 158/78 (BP Location: Right Arm, Patient Position: Sitting, Cuff Size: Normal)   Pulse 73   Temp 97.7 F (36.5 C) (Oral)   Ht 5\' 3"  (1.6 m)   Wt 181 lb 9.6 oz (82.4 kg)   SpO2 98%   BMI 32.17 kg/m  Gen: NAD, resting comfortably HEENT: Mucous membranes are moist. Oropharynx normal CV: RRR no murmurs rubs or gallops Lungs: CTAB no crackles, wheeze, rhonchi Abdomen: soft/nontender/nondistended/normal bowel sounds. No rebound or guarding.  Ext: trace edema Skin: warm, dry Neuro: grossly normal, moves all extremities, PERRLA  Assessment/Plan:  AWV completed- discussed recommended screenings anddocumented any personalized health advice and referrals for preventive counseling. See AVS as well which was given to patient.   Status of chronic or acute concerns   Mammogram in November planned. Declines breast exam  Vit D deficieicny- takes  1000 IU daily  History of colon polyps with adenoma- repeat 5 years from 2017 in 2022  Very mild anemia- stable   Hyperlipidemia S: mild poorly controlled on no rx. No myalgias.  Lab Results  Component Value Date   CHOL 181 05/19/2016   HDL 32.50 (L) 05/19/2016   LDLCALC 121 (H) 05/19/2016   TRIG 139.0 05/19/2016   CHOLHDL 6 05/19/2016   A/P: 10.9% 10 year risk - discussed need for weight loss, healthy eating. She wants to trial this without statin over next year as weight up over 10 lbs- if remains up next year, agrees to consider statin   Essential hypertension S: controlled on amlodipine 10mg , lisinopril 10mg , metoprolol 100mg  XR at home with home cuff verified in past and again today. White coat HTN in office. BP Readings from Last 3  Encounters:  05/26/16 (!) 158/78  11/25/15 (!) 150/70  09/29/15 122/66  A/P:Continue current meds:  Doing very well   CKD (chronic kidney disease), stage III S: Creatinine prior baseline 1-1.2 but in 2015 went into 1.4-1.8 range as we attempted to get in office BPs better controlled with hctz and lisinopril. Off hctz improved back into 1.2-1.4 range but now back up to 1.53 A/P: tough balance here as need to control BP to prevent CKD progression but tempted to stop ace-I in case contributing to rise in creatinine- this could just be part of her variation pattern though and opted for 4 month repeat BMET. Patient  Is planning to establish with Dr. Sharlet Salina at [facility] as much closer to home and will see if we can get her in about 4 months from now.    4 months with Dr. Sharlet Salina if she is still accepting patients  Meds ordered this encounter  Medications  . Tdap (BOOSTRIX) 5-2.5-18.5 LF-MCG/0.5 injection    Sig: Inject 0.5 mLs into the muscle once.    Dispense:  0.5 mL    Refill:  0    Return precautions advised.  [name], MD

## 2016-05-26 NOTE — Patient Instructions (Addendum)
These are the goals we discussed:  1. High dose flu shot 2. Advised 150 minutes exercise per week 3. Call your audiologist 4. Try to get Tdap at pharmacy and let us know through mychart if you do  5. Consider mediterranean diet 6. Follow up 4 months with me or Dr. Sharlet Salina   [name] , Thank you for taking time to come for your Medicare Wellness Visit. I appreciate your ongoing commitment to your health goals. Please review the following plan we discussed and let me know if I can assist you in the future.   This is a list of the screening recommended for you and due dates:  Health Maintenance  Topic Date Due  . Tetanus Vaccine  11/01/1966  . Flu Shot  03/02/2016  . Shingles Vaccine - too expensive 06/02/2044*  .  Hepatitis C: One time screening is recommended by Center for Disease Control  (CDC) for  adults born from [birth year] through 1963.   07/30/2098*  . Mammogram  06/23/2017  . Colon Cancer Screening  09/28/2020  . DEXA scan (bone density measurement)  Completed  . Pneumonia vaccines  Completed  *Topic was postponed. The date shown is not the original due date.

## 2016-05-26 NOTE — Assessment & Plan Note (Signed)
S: Creatinine prior baseline 1-1.2 but in 2015 went into 1.4-1.8 range as we attempted to get in office BPs better controlled with hctz and lisinopril. Off hctz improved back into 1.2-1.4 range but now back up to 1.53 A/P: tough balance here as need to control BP to prevent CKD progression but tempted to stop ace-I in case contributing to rise in creatinine- this could just be part of her variation pattern though and opted for 4 month repeat BMET. Patient  Is planning to establish with Dr. Sharlet Salina at [facility] as much closer to home and will see if we can get her in about 4 months from now.

## 2016-06-07 ENCOUNTER — Telehealth: Payer: Self-pay | Admitting: Family Medicine

## 2016-06-07 NOTE — Telephone Encounter (Signed)
Pt called in said that Rushie Chestnut spoke with you and and said that she was to call back and you ok [place] to put her on as a new pt with you?  Is this correct?

## 2016-06-07 NOTE — Telephone Encounter (Signed)
Yeah that's fine

## 2016-06-28 ENCOUNTER — Ambulatory Visit
Admission: RE | Admit: 2016-06-28 | Discharge: 2016-06-28 | Disposition: A | Discharge status: Home / Self Care | Payer: Medicare Other | Source: Ambulatory Visit | Attending: Family Medicine | Admitting: Family Medicine

## 2016-06-28 DIAGNOSIS — Z1231 Encounter for screening mammogram for malignant neoplasm of breast: Secondary | ICD-10-CM

## 2016-07-06 ENCOUNTER — Encounter: Payer: Self-pay | Admitting: Internal Medicine

## 2016-07-06 ENCOUNTER — Ambulatory Visit (INDEPENDENT_AMBULATORY_CARE_PROVIDER_SITE_OTHER): Payer: Medicare Other | Admitting: Internal Medicine

## 2016-07-06 DIAGNOSIS — I10 Essential (primary) hypertension: Secondary | ICD-10-CM | POA: Diagnosis not present

## 2016-07-06 DIAGNOSIS — E785 Hyperlipidemia, unspecified: Secondary | ICD-10-CM

## 2016-07-06 NOTE — Progress Notes (Signed)
   Subjective:    Patient ID: Sara Rice, female    DOB: [birth year], 70 y.o.   MRN: [id number]  HPI The patient is a 70 YO female coming in for new provider as her old provider was too far from her house (she does not drive). She is following up on her blood pressure. She is taking her medications daily and took them this morning. She checks her blood pressure at home 1-2 times per day and typically 130/80s. She denies headaches, chest pains, SOB. No hx stroke or heart attack. No new concerns.   Review of Systems  Constitutional: Negative for activity change, appetite change, fatigue, fever and unexpected weight change.  HENT: Negative.   Respiratory: Negative.   Cardiovascular: Negative.   Gastrointestinal: Negative.   Musculoskeletal: Negative.   Neurological: Negative.       Objective:   Physical Exam  Constitutional: She appears well-developed and well-nourished.  Overweight  HENT:  Head: Normocephalic and atraumatic.  Eyes: EOM are normal.  Neck: Normal range of motion.  Cardiovascular: Normal rate and regular rhythm.   Pulmonary/Chest: Effort normal and breath sounds normal.  Abdominal: Soft. She exhibits no distension. There is no tenderness. There is no rebound.  Musculoskeletal: She exhibits no edema.  Neurological: Coordination normal.  Skin: Skin is warm and dry.   Vitals:   07/06/16 0803 07/06/16 0821  BP: (!) 156/80 130/62  Pulse: 73   Resp: 20   Temp: 98.3 F (36.8 C)   TempSrc: Oral   SpO2: 99%   Weight: 181 lb 1.9 oz (82.2 kg)   Height: 5\' 3"  (1.6 m)       Assessment & Plan:

## 2016-07-06 NOTE — Assessment & Plan Note (Signed)
Last LDL at goal of <130. Not on meds. Working on increasing exercise.

## 2016-07-06 NOTE — Patient Instructions (Signed)
We will see you back in about 6 months or so to make sure you are doing well.   Keep working on exercise to bring the weight down some. Work on staying the same weight during the holidays and start working on losing about 5 pounds after the new year.    Exercising to Lose Weight Introduction Exercising can help you to lose weight. In order to lose weight through exercise, you need to do vigorous-intensity exercise. You can tell that you are exercising with vigorous intensity if you are breathing very hard and fast and cannot hold a conversation while exercising. Moderate-intensity exercise helps to maintain your current weight. You can tell that you are exercising at a moderate level if you have a higher heart rate and faster breathing, but you are still able to hold a conversation. How often should I exercise? Choose an activity that you enjoy and set realistic goals. Your health care provider can help you to make an activity plan that works for you. Exercise regularly as directed by your health care provider. This may include:  Doing resistance training twice each week, such as:  Push-ups.  Sit-ups.  Lifting weights.  Using resistance bands.  Doing a given intensity of exercise for a given amount of time. Choose from these options:  150 minutes of moderate-intensity exercise every week.  75 minutes of vigorous-intensity exercise every week.  A mix of moderate-intensity and vigorous-intensity exercise every week. Children, pregnant women, people who are out of shape, people who are overweight, and older adults may need to consult a health care provider for individual recommendations. If you have any sort of medical condition, be sure to consult your health care provider before starting a new exercise program. What are some activities that can help me to lose weight?  Walking at a rate of at least 4.5 miles an hour.  Jogging or running at a rate of 5 miles per hour.  Biking at a  rate of at least 10 miles per hour.  Lap swimming.  Roller-skating or in-line skating.  Cross-country skiing.  Vigorous competitive sports, such as football, basketball, and soccer.  Jumping rope.  Aerobic dancing. How can I be more active in my day-to-day activities?  Use the stairs instead of the elevator.  Take a walk during your lunch break.  If you drive, park your car farther away from work or school.  If you take public transportation, get off one stop early and walk the rest of the way.  Make all of your phone calls while standing up and walking around.  Get up, stretch, and walk around every 30 minutes throughout the day. What guidelines should I follow while exercising?  Do not exercise so much that you hurt yourself, feel dizzy, or get very short of breath.  Consult your health care provider prior to starting a new exercise program.  Wear comfortable clothes and shoes with good support.  Drink plenty of water while you exercise to prevent dehydration or heat stroke. Body water is lost during exercise and must be replaced.  Work out until you breathe faster and your heart beats faster. This information is not intended to replace advice given to you by your health care provider. Make sure you discuss any questions you have with your health care provider. Document Released: 08/21/2010 Document Revised: 12/25/2015 Document Reviewed: 12/20/2013  2017 Elsevier

## 2016-07-06 NOTE — Assessment & Plan Note (Signed)
Complicated by CKD stage 3, BP at goal now on metoprolol, lisinopril and amlodipine. No med adjustment today, recent BMP stable.

## 2016-07-06 NOTE — Progress Notes (Signed)
Pre visit review using our clinic review tool, if applicable. No additional management support is needed unless otherwise documented below in the visit note. 

## 2016-09-27 ENCOUNTER — Ambulatory Visit: Payer: Medicare Other | Admitting: Family Medicine

## 2016-12-08 ENCOUNTER — Other Ambulatory Visit: Payer: Self-pay | Admitting: Family Medicine

## 2016-12-10 ENCOUNTER — Other Ambulatory Visit: Payer: Self-pay | Admitting: Family Medicine

## 2016-12-11 ENCOUNTER — Other Ambulatory Visit: Payer: Self-pay | Admitting: Internal Medicine

## 2017-01-04 ENCOUNTER — Ambulatory Visit (INDEPENDENT_AMBULATORY_CARE_PROVIDER_SITE_OTHER): Payer: Medicare Other | Admitting: Internal Medicine

## 2017-01-04 ENCOUNTER — Encounter: Payer: Self-pay | Admitting: Internal Medicine

## 2017-01-04 DIAGNOSIS — I10 Essential (primary) hypertension: Secondary | ICD-10-CM

## 2017-01-04 DIAGNOSIS — Z23 Encounter for immunization: Secondary | ICD-10-CM | POA: Diagnosis not present

## 2017-01-04 NOTE — Patient Instructions (Signed)
We have given you the tetanus shot today.   Come back in about 6 months to check in on the health. If you need [place] sooner please feel free to call.

## 2017-01-04 NOTE — Progress Notes (Signed)
   Subjective:    Patient ID: Sara Rice, female    DOB: [birth year], 71 y.o.   MRN: [id number]  HPI The patient is a 71 YO female coming in for follow up of her blood pressure (taking metoprolol and amlodipine and lisinopril, BP at goal at home, took medications this morning, complicated by CKD stage 3 which is stable). Denies new concerns. Some mild cough which she had with the lisinopril before but she is not bothered by it enough to change medications.   Review of Systems  Constitutional: Negative.   Respiratory: Negative.   Cardiovascular: Negative.   Gastrointestinal: Negative.   Musculoskeletal: Negative.   Skin: Negative.   Neurological: Negative.       Objective:   Physical Exam  Constitutional: She is oriented to person, place, and time. She appears well-developed and well-nourished.  HENT:  Head: Normocephalic and atraumatic.  Eyes: EOM are normal.  Neck: Normal range of motion.  Cardiovascular: Normal rate and regular rhythm.   Pulmonary/Chest: Effort normal.  Abdominal: Soft.  Musculoskeletal: She exhibits no edema.  Neurological: She is alert and oriented to person, place, and time.  Skin: Skin is warm and dry.   Vitals:   01/04/17 0958  BP: (!) 144/76  Pulse: 81  Resp: 12  Temp: 98.9 F (37.2 C)  TempSrc: Oral  SpO2: 99%  Weight: 176 lb (79.8 kg)  Height: 5\' 3"  (1.6 m)      Assessment & Plan:  Tdap given at visit

## 2017-01-07 NOTE — Assessment & Plan Note (Signed)
Taking amlodipine, metoprolol and lisinopril. BP at goal today. Complicated by CKD stage 3 which is stable. Mild cough we talked about could be from lisinopril but she declines change today.

## 2017-02-17 ENCOUNTER — Other Ambulatory Visit: Payer: Self-pay | Admitting: Family Medicine

## 2017-02-22 ENCOUNTER — Other Ambulatory Visit: Payer: Self-pay | Admitting: Internal Medicine

## 2017-05-17 ENCOUNTER — Other Ambulatory Visit: Payer: Self-pay | Admitting: Internal Medicine

## 2017-05-17 DIAGNOSIS — Z1231 Encounter for screening mammogram for malignant neoplasm of breast: Secondary | ICD-10-CM

## 2017-05-23 ENCOUNTER — Telehealth: Payer: Self-pay | Admitting: Family

## 2017-05-27 MED ORDER — AMLODIPINE BESYLATE 10 MG PO TABS: 10.0000 mg | ORAL_TABLET | Freq: Every day | ORAL | 0 refills | Status: DC

## 2017-05-27 MED ORDER — LISINOPRIL 10 MG PO TABS: 10.0000 mg | ORAL_TABLET | Freq: Every day | ORAL | 0 refills | Status: DC

## 2017-05-27 NOTE — Telephone Encounter (Signed)
Pt called in about these med refills   [facility] [address]

## 2017-05-27 NOTE — Addendum Note (Signed)
Addended by: Earnstine Regal on: 05/27/2017 03:13 PM   Modules accepted: Orders

## 2017-05-27 NOTE — Telephone Encounter (Signed)
Per office policy sent 30 day to local pharmacy until appt 07/05/17./lmb

## 2017-06-27 ENCOUNTER — Telehealth: Payer: Self-pay | Admitting: Internal Medicine

## 2017-06-27 MED ORDER — METOPROLOL SUCCINATE ER 100 MG PO TB24: 100.0000 mg | ORAL_TABLET | Freq: Every day | ORAL | 0 refills | Status: DC

## 2017-06-27 MED ORDER — LISINOPRIL 10 MG PO TABS: 10.0000 mg | ORAL_TABLET | Freq: Every day | ORAL | 0 refills | Status: DC

## 2017-06-27 MED ORDER — AMLODIPINE BESYLATE 10 MG PO TABS: 10.0000 mg | ORAL_TABLET | Freq: Every day | ORAL | 0 refills | Status: DC

## 2017-06-27 NOTE — Telephone Encounter (Signed)
Patient has appt scheduled for 12/4.  She is requesting refill on metoprolol, amlodipine and lisinopril to be sent to [facility] on [address].

## 2017-06-27 NOTE — Telephone Encounter (Signed)
[facility] Patient Name: Sara Rice DOB: 04/13/1946 Initial Comment Caller states she needs a refill on her Metoprolol, Amlodipine, Lisinopril. Caller has an appointment scheduled for December 4. Caller states she has pain in the back of her eyes and a headache. Nurse Assessment Nurse: Markus Daft, RN, Sherre Poot Date/Time (Eastern Time): 06/27/2017 2:53:56 PM Please select the assessment type ---Refill Additional Documentation ---Caller states she needs a refill on her Metoprolol 100 mg QHS (she has been out of for one wk), Amlodipine 10 mg QD (two tabs left), and Lisinopril 10 mg QD (two tabs left). -- Caller has an appointment scheduled for December 4. Does the patient have enough medication to last until the office opens? ---Unable to obtain loaner dose from Pharmacy Does the client directives allow for assistance with medications after hours? ---Yes Was the medication filled within the last 6 months? ---Yes What is the name of the medication, dose and instructions as listed on the bottle? ---as stated Name of the physician as listed on the bottle. ---Dr. Sharlet Salina (used to see Dr. Yong Channel) - last seen in June Pharmacy name and phone number where most recently filled. ---[facility] on [address] [phone] Additional Documentation ---Office staff are aware that she needs the refills. Nurse: Markus Daft, RN, [place] Date/Time (Eastern Time): 06/27/2017 2:53:41 PM Confirm and document reason for call. If symptomatic, describe symptoms. ---Caller states she has pain in the back of both of her eyes and a headache (7/10 now). Feels its been d/t HTN since off of Metoprolol for last 2 days (clarified, not a week). Last BP 156/70, HR 77. She has not taken any OTC pain reliever. S/S started in last 2 days Does the patient have any new or worsening symptoms? ---Yes Will a triage be completed?  ---Yes Related visit to physician within the last 2 weeks? ---No Does the PT have any chronic conditions? (i.e. diabetes, asthma, etc.) ---Yes List chronic conditions. ---HTN Is this a behavioral health or substance abuse call? ---No Guidelines Guideline Title Affirmed Question Affirmed Notes Headache [1] Headache AND [2] has not taken pain medications Final Disposition User [place], RN, [place] Comments RN spoke with office staff, Jonelle Sidle, notified. Caller Disagree/Comply Comply Caller Understands Yes PreDisposition Call Doctor

## 2017-06-27 NOTE — Telephone Encounter (Signed)
Per office policy sent 30 day to local pharmacy until appt.../lmb  

## 2017-06-30 ENCOUNTER — Ambulatory Visit
Admission: RE | Admit: 2017-06-30 | Discharge: 2017-06-30 | Disposition: A | Discharge status: Home / Self Care | Payer: Medicare Other | Source: Ambulatory Visit | Attending: Internal Medicine | Admitting: Internal Medicine

## 2017-06-30 DIAGNOSIS — Z1231 Encounter for screening mammogram for malignant neoplasm of breast: Secondary | ICD-10-CM | POA: Diagnosis not present

## 2017-07-05 ENCOUNTER — Ambulatory Visit: Payer: Medicare Other | Admitting: Internal Medicine

## 2017-07-06 ENCOUNTER — Other Ambulatory Visit (INDEPENDENT_AMBULATORY_CARE_PROVIDER_SITE_OTHER): Payer: Medicare Other

## 2017-07-06 ENCOUNTER — Encounter: Payer: Self-pay | Admitting: Internal Medicine

## 2017-07-06 ENCOUNTER — Ambulatory Visit (INDEPENDENT_AMBULATORY_CARE_PROVIDER_SITE_OTHER): Payer: Medicare Other | Admitting: Internal Medicine

## 2017-07-06 VITALS — BP 150/80 | HR 79 | Temp 97.6°F | Ht 63.0 in | Wt 187.0 lb

## 2017-07-06 DIAGNOSIS — E785 Hyperlipidemia, unspecified: Secondary | ICD-10-CM | POA: Diagnosis not present

## 2017-07-06 DIAGNOSIS — I10 Essential (primary) hypertension: Secondary | ICD-10-CM

## 2017-07-06 DIAGNOSIS — Z23 Encounter for immunization: Secondary | ICD-10-CM

## 2017-07-06 DIAGNOSIS — N183 Chronic kidney disease, stage 3 unspecified: Secondary | ICD-10-CM

## 2017-07-06 LAB — COMPREHENSIVE METABOLIC PANEL
ALBUMIN: 4.3 g/dL (ref 3.5–5.2)
ALT: 17 U/L (ref 0–35)
AST: 16 U/L (ref 0–37)
Alkaline Phosphatase: 80 U/L (ref 39–117)
BUN: 20 mg/dL (ref 6–23)
CHLORIDE: 104 meq/L (ref 96–112)
CO2: 28 mEq/L (ref 19–32)
Calcium: 9.5 mg/dL (ref 8.4–10.5)
Creatinine, Ser: 1.31 mg/dL — ABNORMAL HIGH (ref 0.40–1.20)
GFR: 42.7 mL/min — AB (ref >60.00)
Glucose, Bld: 96 mg/dL (ref 70–99)
POTASSIUM: 4.5 meq/L (ref 3.5–5.1)
SODIUM: 139 meq/L (ref 135–145)
Total Bilirubin: 0.3 mg/dL (ref 0.2–1.2)
Total Protein: 8.6 g/dL — ABNORMAL HIGH (ref 6.0–8.3)

## 2017-07-06 LAB — LIPID PANEL
CHOLESTEROL: 186 mg/dL (ref 0–200)
HDL: 33.1 mg/dL — AB (ref >39.00)
LDL CALC: 125 mg/dL — AB (ref 0–99)
NonHDL: 152.61
TRIGLYCERIDES: 137 mg/dL (ref 0.0–149.0)
Total CHOL/HDL Ratio: 6
VLDL: 27.4 mg/dL (ref 0.0–40.0)

## 2017-07-06 LAB — CBC
HEMATOCRIT: 34.9 % — AB (ref 36.0–46.0)
HEMOGLOBIN: 11.6 g/dL — AB (ref 12.0–15.0)
MCHC: 33.4 g/dL (ref 30.0–36.0)
MCV: 89.4 fl (ref 78.0–100.0)
Platelets: 362 10*3/uL (ref 150.0–400.0)
RBC: 3.9 Mil/uL (ref 3.87–5.11)
RDW: 13.5 % (ref 11.5–15.5)
WBC: 6.1 10*3/uL (ref 4.0–10.5)

## 2017-07-06 NOTE — Progress Notes (Signed)
   Subjective:    Patient ID: Sara Rice, female    DOB: [birth year], 71 y.o.   MRN: [id number]  HPI The patient is a 71 YO female coming in for follow up of her blood pressure. Denies side effects, cough, chest pains, headaches, SOB. She is still taking amlodipine and lisinopril and metoprolol. She heard about a recall for amlodipine and wanted to know if that affected her.   Review of Systems  Constitutional: Negative.   HENT: Negative.   Eyes: Negative.   Respiratory: Negative for cough, chest tightness and shortness of breath.   Cardiovascular: Negative for chest pain, palpitations and leg swelling.  Gastrointestinal: Negative for abdominal distention, abdominal pain, constipation, diarrhea, nausea and vomiting.  Musculoskeletal: Negative.   Skin: Negative.   Neurological: Negative.   Psychiatric/Behavioral: Negative.       Objective:   Physical Exam  Constitutional: She is oriented to person, place, and time. She appears well-developed and well-nourished.  HENT:  Head: Normocephalic and atraumatic.  Eyes: EOM are normal.  Neck: Normal range of motion.  Cardiovascular: Normal rate and regular rhythm.  Pulmonary/Chest: Effort normal and breath sounds normal. No respiratory distress. She has no wheezes. She has no rales.  Abdominal: Soft. Bowel sounds are normal. She exhibits no distension. There is no tenderness. There is no rebound.  Musculoskeletal: She exhibits no edema.  Neurological: She is alert and oriented to person, place, and time. Coordination normal.  Skin: Skin is warm and dry.   Vitals:   07/06/17 0945  BP: (!) 150/80  Pulse: 79  Temp: 97.6 F (36.4 C)  TempSrc: Oral  SpO2: 100%  Weight: 187 lb (84.8 kg)  Height: 5\' 3"  (1.6 m)      Assessment & Plan:  Flu shot given at visit

## 2017-07-06 NOTE — Patient Instructions (Signed)
We will check the labs today and call you back with the results.    Health Maintenance, Female Adopting a healthy lifestyle and getting preventive care can go a long way to promote health and wellness. Talk with your health care provider about what schedule of regular examinations is right for you. This is a good chance for you to check in with your provider about disease prevention and staying healthy. In between checkups, there are plenty of things you can do on your own. Experts have done a lot of research about which lifestyle changes and preventive measures are most likely to keep you healthy. Ask your health care provider for more information. Weight and diet Eat a healthy diet  Be sure to include plenty of vegetables, fruits, low-fat dairy products, and lean protein.  Do not eat a lot of foods high in solid fats, added sugars, or salt.  Get regular exercise. This is one of the most important things you can do for your health. ? Most adults should exercise for at least 150 minutes each week. The exercise should increase your heart rate and make you sweat (moderate-intensity exercise). ? Most adults should also do strengthening exercises at least twice a week. This is in addition to the moderate-intensity exercise.  Maintain a healthy weight  Body mass index (BMI) is a measurement that can be used to identify possible weight problems. It estimates body fat based on height and weight. Your health care provider can help determine your BMI and help you achieve or maintain a healthy weight.  For females 20 years of age and older: ? A BMI below 18.5 is considered underweight. ? A BMI of 18.5 to 24.9 is normal. ? A BMI of 25 to 29.9 is considered overweight. ? A BMI of 30 and above is considered obese.  Watch levels of cholesterol and blood lipids  You should start having your blood tested for lipids and cholesterol at 69 years of age, then have this test every 5 years.  You may need to  have your cholesterol levels checked more often if: ? Your lipid or cholesterol levels are high. ? You are older than 69 years of age. ? You are at high risk for heart disease.  Cancer screening Lung Cancer  Lung cancer screening is recommended for adults 55-80 years old who are at high risk for lung cancer because of a history of smoking.  A yearly low-dose CT scan of the lungs is recommended for people who: ? Currently smoke. ? Have quit within the past 15 years. ? Have at least a 30-pack-year history of smoking. A pack year is smoking an average of one pack of cigarettes a day for 1 year.  Yearly screening should continue until it has been 15 years since you quit.  Yearly screening should stop if you develop a health problem that would prevent you from having lung cancer treatment.  Breast Cancer  Practice breast self-awareness. This means understanding how your breasts normally appear and feel.  It also means doing regular breast self-exams. Let your health care provider know about any changes, no matter how small.  If you are in your 20s or 30s, you should have a clinical breast exam (CBE) by a health care provider every 1-3 years as part of a regular health exam.  If you are 40 or older, have a CBE every year. Also consider having a breast X-ray (mammogram) every year.  If you have a family history of breast cancer,   to your health care provider about genetic screening.  If you are at high risk for breast cancer, talk to your health care provider about having an MRI and a mammogram every year.  Breast cancer gene (BRCA) assessment is recommended for women who have family members with BRCA-related cancers. BRCA-related cancers include: ? Breast. ? Ovarian. ? Tubal. ? Peritoneal cancers.  Results of the assessment will determine the need for genetic counseling and BRCA1 and BRCA2 testing.  Cervical Cancer Your health care provider may recommend that you be screened  regularly for cancer of the pelvic organs (ovaries, uterus, and vagina). This screening involves a pelvic examination, including checking for microscopic changes to the surface of your cervix (Pap test). You may be encouraged to have this screening done every 3 years, beginning at age 21.  For women ages 30-65, health care providers may recommend pelvic exams and Pap testing every 3 years, or they may recommend the Pap and pelvic exam, combined with testing for human papilloma virus (HPV), every 5 years. Some types of HPV increase your risk of cervical cancer. Testing for HPV may also be done on women of any age with unclear Pap test results.  Other health care providers may not recommend any screening for nonpregnant women who are considered low risk for pelvic cancer and who do not have symptoms. Ask your health care provider if a screening pelvic exam is right for you.  If you have had past treatment for cervical cancer or a condition that could lead to cancer, you need Pap tests and screening for cancer for at least 20 years after your treatment. If Pap tests have been discontinued, your risk factors (such as having a new sexual partner) need to be reassessed to determine if screening should resume. Some women have medical problems that increase the chance of getting cervical cancer. In these cases, your health care provider may recommend more frequent screening and Pap tests.  Colorectal Cancer  This type of cancer can be detected and often prevented.  Routine colorectal cancer screening usually begins at 69 years of age and continues through 69 years of age.  Your health care provider may recommend screening at an earlier age if you have risk factors for colon cancer.  Your health care provider may also recommend using home test kits to check for hidden blood in the stool.  A small camera at the end of a tube can be used to examine your colon directly (sigmoidoscopy or colonoscopy). This is  done to check for the earliest forms of colorectal cancer.  Routine screening usually begins at age 50.  Direct examination of the colon should be repeated every 5-10 years through 69 years of age. However, you may need to be screened more often if early forms of precancerous polyps or small growths are found.  Skin Cancer  Check your skin from head to toe regularly.  Tell your health care provider about any new moles or changes in moles, especially if there is a change in a mole's shape or color.  Also tell your health care provider if you have a mole that is larger than the size of a pencil eraser.  Always use sunscreen. Apply sunscreen liberally and repeatedly throughout the day.  Protect yourself by wearing long sleeves, pants, a wide-brimmed hat, and sunglasses whenever you are outside.  Heart disease, diabetes, and high blood pressure  High blood pressure causes heart disease and increases the risk of stroke. High blood pressure is more   likely to develop in: ? People who have blood pressure in the high end of the normal range (130-139/85-89 mm Hg). ? People who are overweight or obese. ? People who are African American.  If you are 90-95 years of age, have your blood pressure checked every 3-5 years. If you are 4 years of age or older, have your blood pressure checked every year. You should have your blood pressure measured twice-once when you are at a hospital or clinic, and once when you are not at a hospital or clinic. Record the average of the two measurements. To check your blood pressure when you are not at a hospital or clinic, you can use: ? An automated blood pressure machine at a pharmacy. ? A home blood pressure monitor.  If you are between 44 years and 21 years old, ask your health care provider if you should take aspirin to prevent strokes.  Have regular diabetes screenings. This involves taking a blood sample to check your fasting blood sugar level. ? If you are  at a normal weight and have a low risk for diabetes, have this test once every three years after 69 years of age. ? If you are overweight and have a high risk for diabetes, consider being tested at a younger age or more often. Preventing infection Hepatitis B  If you have a higher risk for hepatitis B, you should be screened for this virus. You are considered at high risk for hepatitis B if: ? You were born in a country where hepatitis B is common. Ask your health care provider which countries are considered high risk. ? Your parents were born in a high-risk country, and you have not been immunized against hepatitis B (hepatitis B vaccine). ? You have HIV or AIDS. ? You use needles to inject street drugs. ? You live with someone who has hepatitis B. ? You have had sex with someone who has hepatitis B. ? You get hemodialysis treatment. ? You take certain medicines for conditions, including cancer, organ transplantation, and autoimmune conditions.  Hepatitis C  Blood testing is recommended for: ? Everyone born from [birth year] through 1963. ? Anyone with known risk factors for hepatitis C.  Sexually transmitted infections (STIs)  You should be screened for sexually transmitted infections (STIs) including gonorrhea and chlamydia if: ? You are sexually active and are younger than 69 years of age. ? You are older than 69 years of age and your health care provider tells you that you are at risk for this type of infection. ? Your sexual activity has changed since you were last screened and you are at an increased risk for chlamydia or gonorrhea. Ask your health care provider if you are at risk.  If you do not have HIV, but are at risk, it may be recommended that you take a prescription medicine daily to prevent HIV infection. This is called pre-exposure prophylaxis (PrEP). You are considered at risk if: ? You are sexually active and do not regularly use condoms or know the HIV status of your  partner(s). ? You take drugs by injection. ? You are sexually active with a partner who has HIV.  Talk with your health care provider about whether you are at high risk of being infected with HIV. If you choose to begin PrEP, you should first be tested for HIV. You should then be tested every 3 months for as long as you are taking PrEP. Pregnancy  If you are premenopausal and you may  become pregnant, ask your health care provider about preconception counseling.  If you may become pregnant, take 400 to 800 micrograms (mcg) of folic acid every day.  If you want to prevent pregnancy, talk to your health care provider about birth control (contraception). Osteoporosis and menopause  Osteoporosis is a disease in which the bones lose minerals and strength with aging. This can result in serious bone fractures. Your risk for osteoporosis can be identified using a bone density scan.  If you are 40 years of age or older, or if you are at risk for osteoporosis and fractures, ask your health care provider if you should be screened.  Ask your health care provider whether you should take a calcium or vitamin D supplement to lower your risk for osteoporosis.  Menopause may have certain physical symptoms and risks.  Hormone replacement therapy may reduce some of these symptoms and risks. Talk to your health care provider about whether hormone replacement therapy is right for you. Follow these instructions at home:  Schedule regular health, dental, and eye exams.  Stay current with your immunizations.  Do not use any tobacco products including cigarettes, chewing tobacco, or electronic cigarettes.  If you are pregnant, do not drink alcohol.  If you are breastfeeding, limit how much and how often you drink alcohol.  Limit alcohol intake to no more than 1 drink per day for nonpregnant women. One drink equals 12 ounces of beer, 5 ounces of wine, or 1 ounces of hard liquor.  Do not use street  drugs.  Do not share needles.  Ask your health care provider for help if you need support or information about quitting drugs.  Tell your health care provider if you often feel depressed.  Tell your health care provider if you have ever been abused or do not feel safe at home. This information is not intended to replace advice given to you by your health care provider. Make sure you discuss any questions you have with your health care provider. Document Released: 02/01/2011 Document Revised: 12/25/2015 Document Reviewed: 04/22/2015 Elsevier Interactive Patient Education  [employer].

## 2017-07-07 NOTE — Assessment & Plan Note (Signed)
Checking CMP and adjust as needed. BP at goal and no diabetes.

## 2017-07-07 NOTE — Assessment & Plan Note (Signed)
Checking lipid panel and adjust as needed. Adjust as needed for goal LDL <130.

## 2017-07-07 NOTE — Assessment & Plan Note (Signed)
Complicated by CKD stage 3, continue amlodipine and lisinopril and metoprolol. Checking CMP and adjust as needed.

## 2017-07-20 ENCOUNTER — Other Ambulatory Visit: Payer: Self-pay

## 2017-07-20 ENCOUNTER — Telehealth: Payer: Self-pay | Admitting: Internal Medicine

## 2017-07-20 MED ORDER — AMLODIPINE BESYLATE 10 MG PO TABS: 10.0000 mg | ORAL_TABLET | Freq: Every day | ORAL | 3 refills | Status: DC

## 2017-07-20 MED ORDER — METOPROLOL SUCCINATE ER 100 MG PO TB24: 100.0000 mg | ORAL_TABLET | Freq: Every day | ORAL | 3 refills | Status: DC

## 2017-07-20 MED ORDER — LISINOPRIL 10 MG PO TABS: 10.0000 mg | ORAL_TABLET | Freq: Every day | ORAL | 11 refills | Status: DC

## 2017-07-20 NOTE — Telephone Encounter (Signed)
Copied from [place] [id number]. Topic: Quick Communication - Rx Refill/Question >> Jul 20, 2017  8:26 AM Yvette Rack wrote: Has the patient contacted their pharmacy? Yes.     (Agent: If no, request that the patient contact the pharmacy for the refill.) lisinopril (PRINIVIL,ZESTRIL) 10 MG tablet  metoprolol succinate (TOPROL-XL) 100 MG 24 hr tablet  amLODipine (NORVASC) 10 MG tablet   Preferred Pharmacy (with phone number or street name): [id number], [place] [phone] (Phone) [phone] (Fax)     Agent: Please be advised that RX refills may take up to 3 business days. We ask that you follow-up with your pharmacy.

## 2017-10-21 ENCOUNTER — Other Ambulatory Visit: Payer: Self-pay | Admitting: Internal Medicine

## 2018-01-04 ENCOUNTER — Ambulatory Visit: Payer: Medicare Other | Admitting: Internal Medicine

## 2018-01-11 ENCOUNTER — Encounter: Payer: Self-pay | Admitting: Internal Medicine

## 2018-01-11 ENCOUNTER — Ambulatory Visit (INDEPENDENT_AMBULATORY_CARE_PROVIDER_SITE_OTHER): Payer: Medicare Other | Admitting: Internal Medicine

## 2018-01-11 VITALS — BP 130/70 | HR 69 | Temp 98.1°F | Ht 63.0 in | Wt 175.0 lb

## 2018-01-11 DIAGNOSIS — N183 Chronic kidney disease, stage 3 unspecified: Secondary | ICD-10-CM

## 2018-01-11 DIAGNOSIS — Z0000 Encounter for general adult medical examination without abnormal findings: Secondary | ICD-10-CM | POA: Diagnosis not present

## 2018-01-11 DIAGNOSIS — M8949 Other hypertrophic osteoarthropathy, multiple sites: Secondary | ICD-10-CM

## 2018-01-11 DIAGNOSIS — M150 Primary generalized (osteo)arthritis: Secondary | ICD-10-CM

## 2018-01-11 DIAGNOSIS — I10 Essential (primary) hypertension: Secondary | ICD-10-CM | POA: Diagnosis not present

## 2018-01-11 DIAGNOSIS — M159 Polyosteoarthritis, unspecified: Secondary | ICD-10-CM

## 2018-01-11 NOTE — Progress Notes (Signed)
   Subjective:    Patient ID: Sara Rice, female    DOB: 08/14/1945, 72 y.o.   MRN: [id number]  HPI Here for medicare wellness and physical, no new complaints. Please see A/P for status and treatment of chronic medical problems.   Diet: heart healthy Physical activity: sedentary Depression/mood screen: negative Hearing: mild to moderate loss bilaterally, declines need for formal hearing exam Visual acuity: grossly normal, performs annual eye exam  ADLs: capable Fall risk: low Home safety: good Cognitive evaluation: intact to orientation, naming, recall and repetition EOL planning: adv directives discussed  I have personally reviewed and have noted 1. The patient's medical and social history - reviewed today no changes 2. Their use of alcohol, tobacco or illicit drugs 3. Their current medications and supplements 4. The patient's functional ability including ADL's, fall risks, home safety risks and hearing or visual impairment. 5. Diet and physical activities 6. Evidence for depression or mood disorders 7. Care team reviewed and updated (available in snapshot)  Review of Systems  Constitutional: Negative.   HENT: Negative.   Eyes: Negative.   Respiratory: Negative for cough, chest tightness and shortness of breath.   Cardiovascular: Negative for chest pain, palpitations and leg swelling.  Gastrointestinal: Negative for abdominal distention, abdominal pain, constipation, diarrhea, nausea and vomiting.  Musculoskeletal: Negative.   Skin: Negative.   Neurological: Negative.   Psychiatric/Behavioral: Negative.       Objective:   Physical Exam  Constitutional: She is oriented to person, place, and time. She appears well-developed and well-nourished.  HENT:  Head: Normocephalic and atraumatic.  Eyes: EOM are normal.  Neck: Normal range of motion.  Cardiovascular: Normal rate and regular rhythm.  Pulmonary/Chest: Effort normal and breath sounds normal. No respiratory  distress. She has no wheezes. She has no rales.  Abdominal: Soft. Bowel sounds are normal. She exhibits no distension. There is no tenderness. There is no rebound.  Musculoskeletal: She exhibits no edema.  Neurological: She is alert and oriented to person, place, and time. Coordination normal.  Skin: Skin is warm and dry.  Psychiatric: She has a normal mood and affect.   Vitals:   01/11/18 0949  BP: 130/70  Pulse: 69  Temp: 98.1 F (36.7 C)  TempSrc: Oral  SpO2: 98%  Weight: 175 lb (79.4 kg)  Height: 5\' 3"  (1.6 m)      Assessment & Plan:

## 2018-01-11 NOTE — Patient Instructions (Signed)

## 2018-01-12 DIAGNOSIS — Z0000 Encounter for general adult medical examination without abnormal findings: Secondary | ICD-10-CM | POA: Insufficient documentation

## 2018-01-12 NOTE — Assessment & Plan Note (Signed)
Rare aleve (no more than twice per week) and tylenol if needed and can use otc creams.

## 2018-01-12 NOTE — Assessment & Plan Note (Addendum)
BP stable, no diabetes. Recent CMP stable.

## 2018-01-12 NOTE — Assessment & Plan Note (Signed)
BP at goal on amlodipine and lisinopril and metoprolol. Recent labs stable.

## 2018-01-12 NOTE — Assessment & Plan Note (Signed)
Flu shot yearly. Pneumonia done. Shingrix counseled. Tetanus up to date. Colonoscopy up to date. Mammogram up to date, pap smear aged out and dexa up to date. Counseled about sun safety and mole surveillance. Counseled about the dangers of distracted driving. Given 10 year screening recommendations.

## 2018-01-17 ENCOUNTER — Other Ambulatory Visit: Payer: Self-pay | Admitting: Internal Medicine

## 2018-07-12 ENCOUNTER — Other Ambulatory Visit: Payer: Self-pay | Admitting: Internal Medicine

## 2018-07-14 ENCOUNTER — Other Ambulatory Visit (INDEPENDENT_AMBULATORY_CARE_PROVIDER_SITE_OTHER): Payer: Medicare Other

## 2018-07-14 ENCOUNTER — Encounter: Payer: Self-pay | Admitting: Internal Medicine

## 2018-07-14 ENCOUNTER — Ambulatory Visit: Payer: Medicare Other

## 2018-07-14 ENCOUNTER — Ambulatory Visit (INDEPENDENT_AMBULATORY_CARE_PROVIDER_SITE_OTHER): Payer: Medicare Other | Admitting: Internal Medicine

## 2018-07-14 VITALS — BP 118/68 | HR 75 | Temp 97.7°F | Ht 63.0 in | Wt 168.0 lb

## 2018-07-14 DIAGNOSIS — N183 Chronic kidney disease, stage 3 unspecified: Secondary | ICD-10-CM

## 2018-07-14 DIAGNOSIS — D649 Anemia, unspecified: Secondary | ICD-10-CM

## 2018-07-14 DIAGNOSIS — Z23 Encounter for immunization: Secondary | ICD-10-CM | POA: Diagnosis not present

## 2018-07-14 DIAGNOSIS — I10 Essential (primary) hypertension: Secondary | ICD-10-CM | POA: Diagnosis not present

## 2018-07-14 LAB — COMPREHENSIVE METABOLIC PANEL
ALBUMIN: 4.3 g/dL (ref 3.5–5.2)
ALT: 17 U/L (ref 0–35)
AST: 16 U/L (ref 0–37)
Alkaline Phosphatase: 89 U/L (ref 39–117)
BILIRUBIN TOTAL: 0.3 mg/dL (ref 0.2–1.2)
BUN: 28 mg/dL — ABNORMAL HIGH (ref 6–23)
CO2: 26 mEq/L (ref 19–32)
Calcium: 10 mg/dL (ref 8.4–10.5)
Chloride: 105 mEq/L (ref 96–112)
Creatinine, Ser: 1.38 mg/dL — ABNORMAL HIGH (ref 0.40–1.20)
GFR: 40.09 mL/min — ABNORMAL LOW (ref >60.00)
Glucose, Bld: 94 mg/dL (ref 70–99)
Potassium: 4.3 mEq/L (ref 3.5–5.1)
Sodium: 139 mEq/L (ref 135–145)
Total Protein: 8.7 g/dL — ABNORMAL HIGH (ref 6.0–8.3)

## 2018-07-14 LAB — CBC
HCT: 34.8 % — ABNORMAL LOW (ref 36.0–46.0)
Hemoglobin: 11.8 g/dL — ABNORMAL LOW (ref 12.0–15.0)
MCHC: 34.1 g/dL (ref 30.0–36.0)
MCV: 88.3 fl (ref 78.0–100.0)
Platelets: 383 10*3/uL (ref 150.0–400.0)
RBC: 3.94 Mil/uL (ref 3.87–5.11)
RDW: 13.3 % (ref 11.5–15.5)
WBC: 6.4 10*3/uL (ref 4.0–10.5)

## 2018-07-14 LAB — LIPID PANEL
CHOLESTEROL: 191 mg/dL (ref 0–200)
HDL: 34.1 mg/dL — ABNORMAL LOW (ref >39.00)
LDL Cholesterol: 126 mg/dL — ABNORMAL HIGH (ref 0–99)
NonHDL: 156.46
Total CHOL/HDL Ratio: 6
Triglycerides: 152 mg/dL — ABNORMAL HIGH (ref 0.0–149.0)
VLDL: 30.4 mg/dL (ref 0.0–40.0)

## 2018-07-14 NOTE — Assessment & Plan Note (Signed)
Taking lisinopril and amlodipine and metoprolol. Checking CMP and adjust as needed.

## 2018-07-14 NOTE — Assessment & Plan Note (Signed)
Checking CBC and adjust as needed.  

## 2018-07-14 NOTE — Patient Instructions (Signed)
We will check the labs today and call you back about the results.    

## 2018-07-14 NOTE — Assessment & Plan Note (Signed)
Checking CMP and adjust as needed. No diabetes and BP at goal.

## 2018-07-14 NOTE — Progress Notes (Signed)
   Subjective:    Patient ID: Sara Rice, female    DOB: 11-04-1945, 72 y.o.   MRN: [id number]  HPI The patient is a 72 YO female coming in for follow up blood pressure (taking amlodipine and lisinopril and metoprolol, denies headaches or chest pains, denies side effects such as lightheadedness or nausea) and anemia (blood counts mildly low and stable, denies blood in stool or dark stool, no nosebleeds) and CKD (stable previously, BP at goal, has been working on healthier diet)  Review of Systems  Constitutional: Negative.   HENT: Negative.   Eyes: Negative.   Respiratory: Negative for cough, chest tightness and shortness of breath.   Cardiovascular: Negative for chest pain, palpitations and leg swelling.  Gastrointestinal: Negative for abdominal distention, abdominal pain, constipation, diarrhea, nausea and vomiting.  Musculoskeletal: Negative.   Skin: Negative.   Neurological: Negative.   Psychiatric/Behavioral: Negative.       Objective:   Physical Exam Constitutional:      Appearance: She is well-developed.  HENT:     Head: Normocephalic and atraumatic.  Neck:     Musculoskeletal: Normal range of motion.  Cardiovascular:     Rate and Rhythm: Normal rate and regular rhythm.  Pulmonary:     Effort: Pulmonary effort is normal. No respiratory distress.     Breath sounds: Normal breath sounds. No wheezing or rales.  Abdominal:     General: Bowel sounds are normal. There is no distension.     Palpations: Abdomen is soft.     Tenderness: There is no abdominal tenderness. There is no rebound.  Skin:    General: Skin is warm and dry.  Neurological:     Mental Status: She is alert and oriented to person, place, and time.     Coordination: Coordination normal.    Vitals:   07/14/18 0923  BP: 118/68  Pulse: 75  Temp: 97.7 F (36.5 C)  TempSrc: Oral  SpO2: 99%  Weight: 168 lb (76.2 kg)  Height: 5\' 3"  (1.6 m)      Assessment & Plan:  Flu shot given at visit

## 2019-01-08 ENCOUNTER — Other Ambulatory Visit: Payer: Self-pay | Admitting: Internal Medicine

## 2019-01-16 ENCOUNTER — Encounter: Payer: Self-pay | Admitting: Internal Medicine

## 2019-01-16 ENCOUNTER — Other Ambulatory Visit (INDEPENDENT_AMBULATORY_CARE_PROVIDER_SITE_OTHER): Payer: Medicare Other

## 2019-01-16 ENCOUNTER — Other Ambulatory Visit: Payer: Self-pay

## 2019-01-16 ENCOUNTER — Ambulatory Visit (INDEPENDENT_AMBULATORY_CARE_PROVIDER_SITE_OTHER): Payer: Medicare Other | Admitting: Internal Medicine

## 2019-01-16 VITALS — BP 160/80 | HR 76 | Temp 97.9°F | Ht 63.0 in | Wt 171.0 lb

## 2019-01-16 DIAGNOSIS — I10 Essential (primary) hypertension: Secondary | ICD-10-CM

## 2019-01-16 DIAGNOSIS — E782 Mixed hyperlipidemia: Secondary | ICD-10-CM | POA: Diagnosis not present

## 2019-01-16 DIAGNOSIS — N183 Chronic kidney disease, stage 3 unspecified: Secondary | ICD-10-CM

## 2019-01-16 DIAGNOSIS — Z0000 Encounter for general adult medical examination without abnormal findings: Secondary | ICD-10-CM

## 2019-01-16 LAB — CBC
HCT: 34.4 % — ABNORMAL LOW (ref 36.0–46.0)
Hemoglobin: 11.7 g/dL — ABNORMAL LOW (ref 12.0–15.0)
MCHC: 33.9 g/dL (ref 30.0–36.0)
MCV: 88.5 fl (ref 78.0–100.0)
Platelets: 360 10*3/uL (ref 150.0–400.0)
RBC: 3.89 Mil/uL (ref 3.87–5.11)
RDW: 13.7 % (ref 11.5–15.5)
WBC: 7.9 10*3/uL (ref 4.0–10.5)

## 2019-01-16 LAB — COMPREHENSIVE METABOLIC PANEL
ALT: 15 U/L (ref 0–35)
AST: 14 U/L (ref 0–37)
Albumin: 4.1 g/dL (ref 3.5–5.2)
Alkaline Phosphatase: 99 U/L (ref 39–117)
BUN: 27 mg/dL — ABNORMAL HIGH (ref 6–23)
CO2: 25 mEq/L (ref 19–32)
Calcium: 9.9 mg/dL (ref 8.4–10.5)
Chloride: 105 mEq/L (ref 96–112)
Creatinine, Ser: 1.23 mg/dL — ABNORMAL HIGH (ref 0.40–1.20)
GFR: 43.02 mL/min — ABNORMAL LOW (ref >60.00)
Glucose, Bld: 100 mg/dL — ABNORMAL HIGH (ref 70–99)
Potassium: 4.4 mEq/L (ref 3.5–5.1)
Sodium: 138 mEq/L (ref 135–145)
Total Bilirubin: 0.2 mg/dL (ref 0.2–1.2)
Total Protein: 8.5 g/dL — ABNORMAL HIGH (ref 6.0–8.3)

## 2019-01-16 LAB — FERRITIN: Ferritin: 469.4 ng/mL — ABNORMAL HIGH (ref 10.0–291.0)

## 2019-01-16 NOTE — Assessment & Plan Note (Signed)
Flu shot counseled. Pneumonia complete. Shingrix counseled. Tetanus 2028. Colonoscopy 2022. Mammogram 2020 and reminded due in November, pap smear aged out and dexa up to date due in 2021. Counseled about sun safety and mole surveillance. Counseled about the dangers of distracted driving. Given 10 year screening recommendations.

## 2019-01-16 NOTE — Progress Notes (Signed)
Subjective:   Patient ID: Sara Rice, female    DOB: 02/04/1946, 73 y.o.   MRN: [id number]  HPI Here for medicare wellness and physical, no new complaints. Please see A/P for status and treatment of chronic medical problems.   Diet: heart healthy Physical activity: sedentary Depression/mood screen: negative Hearing: intact to whispered voice Visual acuity: grossly normal with lens, performs annual eye exam  ADLs: capable Fall risk: none Home safety: good Cognitive evaluation: intact to orientation, naming, recall and repetition EOL planning: adv directives discussed    Office Visit from 01/16/2019 in [facility]  PHQ-2 Total Score  1      I have personally reviewed and have noted 1. The patient's medical and social history - reviewed today no changes 2. Their use of alcohol, tobacco or illicit drugs 3. Their current medications and supplements 4. The patient's functional ability including ADL's, fall risks, home safety risks and hearing or visual impairment. 5. Diet and physical activities 6. Evidence for depression or mood disorders 7. Care team reviewed and updated  Patient Care Team: Hoyt Koch, MD as PCP - General (Internal Medicine) Past Medical History:  Diagnosis Date  . Anemia, unspecified   . Benign neoplasm of colon   . Calculus of kidney   . HEMORRHOIDS 05/26/2009  . Osteoarthrosis, unspecified whether generalized or localized, unspecified site   . Other and unspecified hyperlipidemia   . Other symptoms involving cardiovascular system   . Unspecified essential hypertension   . Unspecified hemorrhoids without mention of complication   . Unspecified vitamin D deficiency    Past Surgical History:  Procedure Laterality Date  . COLONOSCOPY    . DENTAL SURGERY     Family History  Problem Relation Age of Onset  . Diabetes Mother   . Hypertension Mother   . Colon polyps Mother   . Stroke Brother        late 66s,  nonsmoker  . Hypertension Brother   . Hypertension Sister        hx of obesity  . Colon cancer Neg Hx     Review of Systems  Constitutional: Negative.   HENT: Negative.   Eyes: Negative.   Respiratory: Negative for cough, chest tightness and shortness of breath.   Cardiovascular: Negative for chest pain, palpitations and leg swelling.  Gastrointestinal: Negative for abdominal distention, abdominal pain, constipation, diarrhea, nausea and vomiting.  Musculoskeletal: Negative.   Skin: Negative.   Neurological: Negative.   Psychiatric/Behavioral: Negative.     Objective:  Physical Exam Constitutional:      Appearance: She is well-developed. She is obese.  HENT:     Head: Normocephalic and atraumatic.  Neck:     Musculoskeletal: Normal range of motion.  Cardiovascular:     Rate and Rhythm: Normal rate and regular rhythm.  Pulmonary:     Effort: Pulmonary effort is normal. No respiratory distress.     Breath sounds: Normal breath sounds. No wheezing or rales.  Abdominal:     General: Bowel sounds are normal. There is no distension.     Palpations: Abdomen is soft.     Tenderness: There is no abdominal tenderness. There is no rebound.  Skin:    General: Skin is warm and dry.  Neurological:     Mental Status: She is alert and oriented to person, place, and time.     Coordination: Coordination normal.     Vitals:   01/16/19 1343  BP: (!) 160/80  Pulse: 76  Temp: 97.9 F (36.6 C)  TempSrc: Oral  SpO2: 98%  Weight: 171 lb (77.6 kg)  Height: 5\' 3"  (1.6 m)    Assessment & Plan:

## 2019-01-16 NOTE — Assessment & Plan Note (Signed)
Checking labs and adjust as needed. BP at goal at home.

## 2019-01-16 NOTE — Patient Instructions (Signed)

## 2019-01-16 NOTE — Assessment & Plan Note (Addendum)
BP mildly elevated but normal at home. Checking CMP and adjust as needed. Taking amlodipine and metoprolol and lisinopril.

## 2019-01-16 NOTE — Assessment & Plan Note (Signed)
Recent labs normal.

## 2019-02-07 ENCOUNTER — Other Ambulatory Visit: Payer: Self-pay | Admitting: Internal Medicine

## 2019-03-21 DIAGNOSIS — H524 Presbyopia: Secondary | ICD-10-CM | POA: Diagnosis not present

## 2019-07-04 ENCOUNTER — Other Ambulatory Visit: Payer: Self-pay | Admitting: Internal Medicine

## 2019-07-04 DIAGNOSIS — Z1231 Encounter for screening mammogram for malignant neoplasm of breast: Secondary | ICD-10-CM

## 2019-07-07 ENCOUNTER — Other Ambulatory Visit: Payer: Self-pay | Admitting: Internal Medicine

## 2019-07-19 ENCOUNTER — Ambulatory Visit: Payer: Medicare Other | Admitting: Internal Medicine

## 2019-08-24 ENCOUNTER — Ambulatory Visit: Payer: Medicare Other

## 2019-09-25 ENCOUNTER — Ambulatory Visit
Admission: RE | Admit: 2019-09-25 | Discharge: 2019-09-25 | Disposition: A | Discharge status: Home / Self Care | Payer: Medicare Other | Source: Ambulatory Visit | Attending: Internal Medicine | Admitting: Internal Medicine

## 2019-09-25 ENCOUNTER — Other Ambulatory Visit: Payer: Self-pay

## 2019-09-25 DIAGNOSIS — Z1231 Encounter for screening mammogram for malignant neoplasm of breast: Secondary | ICD-10-CM

## 2020-01-03 ENCOUNTER — Other Ambulatory Visit: Payer: Self-pay | Admitting: Internal Medicine

## 2020-01-18 ENCOUNTER — Other Ambulatory Visit: Payer: Self-pay

## 2020-01-18 ENCOUNTER — Ambulatory Visit (INDEPENDENT_AMBULATORY_CARE_PROVIDER_SITE_OTHER): Payer: Medicare Other | Admitting: Internal Medicine

## 2020-01-18 ENCOUNTER — Encounter: Payer: Self-pay | Admitting: Internal Medicine

## 2020-01-18 VITALS — BP 126/78 | HR 72 | Temp 98.3°F | Ht 63.0 in | Wt 175.0 lb

## 2020-01-18 DIAGNOSIS — Z0000 Encounter for general adult medical examination without abnormal findings: Secondary | ICD-10-CM

## 2020-01-18 DIAGNOSIS — I10 Essential (primary) hypertension: Secondary | ICD-10-CM

## 2020-01-18 DIAGNOSIS — N1831 Chronic kidney disease, stage 3a: Secondary | ICD-10-CM

## 2020-01-18 DIAGNOSIS — E782 Mixed hyperlipidemia: Secondary | ICD-10-CM | POA: Diagnosis not present

## 2020-01-18 LAB — LIPID PANEL
Cholesterol: 208 mg/dL — ABNORMAL HIGH (ref 0–200)
HDL: 34.1 mg/dL — ABNORMAL LOW (ref >39.00)
LDL Cholesterol: 147 mg/dL — ABNORMAL HIGH (ref 0–99)
NonHDL: 173.69
Total CHOL/HDL Ratio: 6
Triglycerides: 134 mg/dL (ref 0.0–149.0)
VLDL: 26.8 mg/dL (ref 0.0–40.0)

## 2020-01-18 LAB — CBC
HCT: 33.5 % — ABNORMAL LOW (ref 36.0–46.0)
Hemoglobin: 11.3 g/dL — ABNORMAL LOW (ref 12.0–15.0)
MCHC: 33.6 g/dL (ref 30.0–36.0)
MCV: 88.5 fl (ref 78.0–100.0)
Platelets: 348 10*3/uL (ref 150.0–400.0)
RBC: 3.79 Mil/uL — ABNORMAL LOW (ref 3.87–5.11)
RDW: 13.8 % (ref 11.5–15.5)
WBC: 6.5 10*3/uL (ref 4.0–10.5)

## 2020-01-18 LAB — COMPREHENSIVE METABOLIC PANEL
ALT: 17 U/L (ref 0–35)
AST: 17 U/L (ref 0–37)
Albumin: 4.1 g/dL (ref 3.5–5.2)
Alkaline Phosphatase: 75 U/L (ref 39–117)
BUN: 27 mg/dL — ABNORMAL HIGH (ref 6–23)
CO2: 27 mEq/L (ref 19–32)
Calcium: 9.9 mg/dL (ref 8.4–10.5)
Chloride: 107 mEq/L (ref 96–112)
Creatinine, Ser: 1.35 mg/dL — ABNORMAL HIGH (ref 0.40–1.20)
GFR: 38.52 mL/min — ABNORMAL LOW (ref >60.00)
Glucose, Bld: 95 mg/dL (ref 70–99)
Potassium: 4.3 mEq/L (ref 3.5–5.1)
Sodium: 138 mEq/L (ref 135–145)
Total Bilirubin: 0.3 mg/dL (ref 0.2–1.2)
Total Protein: 8.4 g/dL — ABNORMAL HIGH (ref 6.0–8.3)

## 2020-01-18 MED ORDER — AMLODIPINE BESYLATE 10 MG PO TABS: 10.0000 mg | ORAL_TABLET | Freq: Every day | ORAL | 3 refills | Status: DC

## 2020-01-18 MED ORDER — METOPROLOL SUCCINATE ER 100 MG PO TB24: 100.0000 mg | ORAL_TABLET | Freq: Every day | ORAL | 3 refills | Status: DC

## 2020-01-18 MED ORDER — LISINOPRIL 10 MG PO TABS: 10.0000 mg | ORAL_TABLET | Freq: Every day | ORAL | 3 refills | Status: DC

## 2020-01-18 NOTE — Assessment & Plan Note (Signed)
BP at goal on amlodipine 10 mg daily, metoprolol 100 mg daily and lisinopril 10 mg daily. Checking CMP and adjust as needed.

## 2020-01-18 NOTE — Assessment & Plan Note (Signed)
BP at goal, no known DM. Checking CMP today.

## 2020-01-18 NOTE — Patient Instructions (Signed)
Health Maintenance, Female Adopting a healthy lifestyle and getting preventive care are important in promoting health and wellness. Ask your health care provider about:  The right schedule for you to have regular tests and exams.  Things you can do on your own to prevent diseases and keep yourself healthy. What should I know about diet, weight, and exercise? Eat a healthy diet   Eat a diet that includes plenty of vegetables, fruits, low-fat dairy products, and lean protein.  Do not eat a lot of foods that are high in solid fats, added sugars, or sodium. Maintain a healthy weight Body mass index (BMI) is used to identify weight problems. It estimates body fat based on height and weight. Your health care provider can help determine your BMI and help you achieve or maintain a healthy weight. Get regular exercise Get regular exercise. This is one of the most important things you can do for your health. Most adults should:  Exercise for at least 150 minutes each week. The exercise should increase your heart rate and make you sweat (moderate-intensity exercise).  Do strengthening exercises at least twice a week. This is in addition to the moderate-intensity exercise.  Spend less time sitting. Even light physical activity can be beneficial. Watch cholesterol and blood lipids Have your blood tested for lipids and cholesterol at 72 years of age, then have this test every 5 years. Have your cholesterol levels checked more often if:  Your lipid or cholesterol levels are high.  You are older than 72 years of age.  You are at high risk for heart disease. What should I know about cancer screening? Depending on your health history and family history, you may need to have cancer screening at various ages. This may include screening for:  Breast cancer.  Cervical cancer.  Colorectal cancer.  Skin cancer.  Lung cancer. What should I know about heart disease, diabetes, and high blood  pressure? Blood pressure and heart disease  High blood pressure causes heart disease and increases the risk of stroke. This is more likely to develop in people who have high blood pressure readings, are of African descent, or are overweight.  Have your blood pressure checked: ? Every 3-5 years if you are 18-39 years of age. ? Every year if you are 40 years old or older. Diabetes Have regular diabetes screenings. This checks your fasting blood sugar level. Have the screening done:  Once every three years after age 40 if you are at a normal weight and have a low risk for diabetes.  More often and at a younger age if you are overweight or have a high risk for diabetes. What should I know about preventing infection? Hepatitis B If you have a higher risk for hepatitis B, you should be screened for this virus. Talk with your health care provider to find out if you are at risk for hepatitis B infection. Hepatitis C Testing is recommended for:  Everyone born from 1945 through 1965.  Anyone with known risk factors for hepatitis C. Sexually transmitted infections (STIs)  Get screened for STIs, including gonorrhea and chlamydia, if: ? You are sexually active and are younger than 72 years of age. ? You are older than 72 years of age and your health care provider tells you that you are at risk for this type of infection. ? Your sexual activity has changed since you were last screened, and you are at increased risk for chlamydia or gonorrhea. Ask your health care provider if   you are at risk.  Ask your health care provider about whether you are at high risk for HIV. Your health care provider may recommend a prescription medicine to help prevent HIV infection. If you choose to take medicine to prevent HIV, you should first get tested for HIV. You should then be tested every 3 months for as long as you are taking the medicine. Pregnancy  If you are about to stop having your period (premenopausal) and  you may become pregnant, seek counseling before you get pregnant.  Take 400 to 800 micrograms (mcg) of folic acid every day if you become pregnant.  Ask for birth control (contraception) if you want to prevent pregnancy. Osteoporosis and menopause Osteoporosis is a disease in which the bones lose minerals and strength with aging. This can result in bone fractures. If you are 65 years old or older, or if you are at risk for osteoporosis and fractures, ask your health care provider if you should:  Be screened for bone loss.  Take a calcium or vitamin D supplement to lower your risk of fractures.  Be given hormone replacement therapy (HRT) to treat symptoms of menopause. Follow these instructions at home: Lifestyle  Do not use any products that contain nicotine or tobacco, such as cigarettes, e-cigarettes, and chewing tobacco. If you need help quitting, ask your health care provider.  Do not use street drugs.  Do not share needles.  Ask your health care provider for help if you need support or information about quitting drugs. Alcohol use  Do not drink alcohol if: ? Your health care provider tells you not to drink. ? You are pregnant, may be pregnant, or are planning to become pregnant.  If you drink alcohol: ? Limit how much you use to 0-1 drink a day. ? Limit intake if you are breastfeeding.  Be aware of how much alcohol is in your drink. In the [place] one drink equals one 12 oz bottle of beer (355 mL), one 5 oz glass of wine (148 mL), or one 1 oz glass of hard liquor (44 mL). General instructions  Schedule regular health, dental, and eye exams.  Stay current with your vaccines.  Tell your health care provider if: ? You often feel depressed. ? You have ever been abused or do not feel safe at home. Summary  Adopting a healthy lifestyle and getting preventive care are important in promoting health and wellness.  Follow your health care provider's instructions about healthy  diet, exercising, and getting tested or screened for diseases.  Follow your health care provider's instructions on monitoring your cholesterol and blood pressure. This information is not intended to replace advice given to you by your health care provider. Make sure you discuss any questions you have with your health care provider. Document Revised: 07/12/2018 Document Reviewed: 07/12/2018 Elsevier Patient Education  2020 [employer]  

## 2020-01-18 NOTE — Progress Notes (Signed)
   Subjective:   Patient ID: Sara Rice, female    DOB: Jul 20, 1946, 74 y.o.   MRN: [id number]  HPI The patient is a 74 YO female coming in for physical. 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   PMH, [facility], social history reviewed and updated  Review of Systems  Constitutional: Negative.   HENT: Negative.   Eyes: Negative.   Respiratory: Negative for cough, chest tightness and shortness of breath.   Cardiovascular: Negative for chest pain, palpitations and leg swelling.  Gastrointestinal: Negative for abdominal distention, abdominal pain, constipation, diarrhea, nausea and vomiting.  Musculoskeletal: Negative.   Skin: Negative.   Neurological: Negative.   Psychiatric/Behavioral: Negative.     Objective:  Physical Exam Constitutional:      Appearance: She is well-developed.  HENT:     Head: Normocephalic and atraumatic.  Cardiovascular:     Rate and Rhythm: Normal rate and regular rhythm.  Pulmonary:     Effort: Pulmonary effort is normal. No respiratory distress.     Breath sounds: Normal breath sounds. No wheezing or rales.  Abdominal:     General: Bowel sounds are normal. There is no distension.     Palpations: Abdomen is soft.     Tenderness: There is no abdominal tenderness. There is no rebound.  Musculoskeletal:     Cervical back: Normal range of motion.  Skin:    General: Skin is warm and dry.  Neurological:     Mental Status: She is alert and oriented to person, place, and time.     Coordination: Coordination normal.     Vitals:   01/18/20 0951  BP: 126/78  Pulse: 72  Temp: 98.3 F (36.8 C)  TempSrc: Oral  SpO2: 97%  Weight: 175 lb (79.4 kg)  Height: 5\' 3"  (1.6 m)    This visit occurred during the SARS-CoV-2 public health emergency.  Safety protocols were in place, including screening questions prior to the visit, additional usage of staff PPE, and extensive cleaning of exam room while observing appropriate contact time as indicated for disinfecting  solutions.   Assessment & Plan:

## 2020-01-18 NOTE — Assessment & Plan Note (Signed)
Flu shot due next season. Covid-19 complete. Pneumonia complete. Shingrix complete. Tetanus up to date. Colonoscopy due 2022. Mammogram due 2022, pap smear aged out and dexa declines further. Counseled about sun safety and mole surveillance. Counseled about the dangers of distracted driving. Given 10 year screening recommendations.

## 2020-01-18 NOTE — Assessment & Plan Note (Signed)
Checking lipid panel. Not on meds currently.

## 2020-10-15 DIAGNOSIS — M25521 Pain in right elbow: Secondary | ICD-10-CM | POA: Diagnosis not present

## 2020-10-16 DIAGNOSIS — M25521 Pain in right elbow: Secondary | ICD-10-CM | POA: Diagnosis not present

## 2020-10-17 DIAGNOSIS — M25521 Pain in right elbow: Secondary | ICD-10-CM | POA: Diagnosis not present

## 2020-10-29 DIAGNOSIS — M25521 Pain in right elbow: Secondary | ICD-10-CM | POA: Diagnosis not present

## 2020-11-19 DIAGNOSIS — M25521 Pain in right elbow: Secondary | ICD-10-CM | POA: Diagnosis not present

## 2021-01-01 ENCOUNTER — Encounter: Payer: Self-pay | Admitting: Internal Medicine

## 2021-01-19 ENCOUNTER — Other Ambulatory Visit: Payer: Self-pay

## 2021-01-19 ENCOUNTER — Encounter: Payer: Self-pay | Admitting: Internal Medicine

## 2021-01-19 ENCOUNTER — Ambulatory Visit (INDEPENDENT_AMBULATORY_CARE_PROVIDER_SITE_OTHER): Payer: Medicare Other | Admitting: Internal Medicine

## 2021-01-19 VITALS — BP 144/78 | HR 91 | Temp 98.4°F | Ht 63.0 in | Wt 167.0 lb

## 2021-01-19 DIAGNOSIS — Z0000 Encounter for general adult medical examination without abnormal findings: Secondary | ICD-10-CM | POA: Diagnosis not present

## 2021-01-19 DIAGNOSIS — I10 Essential (primary) hypertension: Secondary | ICD-10-CM

## 2021-01-19 DIAGNOSIS — E782 Mixed hyperlipidemia: Secondary | ICD-10-CM

## 2021-01-19 DIAGNOSIS — Z86010 Personal history of colonic polyps: Secondary | ICD-10-CM

## 2021-01-19 DIAGNOSIS — N1831 Chronic kidney disease, stage 3a: Secondary | ICD-10-CM | POA: Diagnosis not present

## 2021-01-19 LAB — COMPREHENSIVE METABOLIC PANEL
ALT: 17 U/L (ref 0–35)
AST: 16 U/L (ref 0–37)
Albumin: 4.1 g/dL (ref 3.5–5.2)
Alkaline Phosphatase: 81 U/L (ref 39–117)
BUN: 26 mg/dL — ABNORMAL HIGH (ref 6–23)
CO2: 27 mEq/L (ref 19–32)
Calcium: 10.2 mg/dL (ref 8.4–10.5)
Chloride: 105 mEq/L (ref 96–112)
Creatinine, Ser: 1.45 mg/dL — ABNORMAL HIGH (ref 0.40–1.20)
GFR: 35.86 mL/min — ABNORMAL LOW (ref >60.00)
Glucose, Bld: 99 mg/dL (ref 70–99)
Potassium: 4.3 mEq/L (ref 3.5–5.1)
Sodium: 139 mEq/L (ref 135–145)
Total Bilirubin: 0.3 mg/dL (ref 0.2–1.2)
Total Protein: 8.6 g/dL — ABNORMAL HIGH (ref 6.0–8.3)

## 2021-01-19 LAB — LIPID PANEL
Cholesterol: 195 mg/dL (ref 0–200)
HDL: 33.4 mg/dL — ABNORMAL LOW (ref >39.00)
LDL Cholesterol: 131 mg/dL — ABNORMAL HIGH (ref 0–99)
NonHDL: 161.14
Total CHOL/HDL Ratio: 6
Triglycerides: 153 mg/dL — ABNORMAL HIGH (ref 0.0–149.0)
VLDL: 30.6 mg/dL (ref 0.0–40.0)

## 2021-01-19 LAB — CBC
HCT: 33.2 % — ABNORMAL LOW (ref 36.0–46.0)
Hemoglobin: 11.2 g/dL — ABNORMAL LOW (ref 12.0–15.0)
MCHC: 33.8 g/dL (ref 30.0–36.0)
MCV: 86.3 fl (ref 78.0–100.0)
Platelets: 368 10*3/uL (ref 150.0–400.0)
RBC: 3.84 Mil/uL — ABNORMAL LOW (ref 3.87–5.11)
RDW: 13.6 % (ref 11.5–15.5)
WBC: 7.4 10*3/uL (ref 4.0–10.5)

## 2021-01-19 NOTE — Patient Instructions (Addendum)
Ask the pharmacist about the shingles vaccine.

## 2021-01-19 NOTE — Assessment & Plan Note (Signed)
Flu shot yearly. Covid-19 up to date. Pneumonia complete. Shingrix counseled to get at pharmacy. Tetanus due 2028. Colonoscopy due. Mammogram due 2023, pap smear aged out and dexa declines further done 2016. Counseled about sun safety and mole surveillance. Counseled about the dangers of distracted driving. Given 10 year screening recommendations.

## 2021-01-19 NOTE — Assessment & Plan Note (Signed)
BP close to goal here and at goal home readings. Checking CMP and adjust as needed. Taking lisinopril and amlodipine and metoprolol.

## 2021-01-19 NOTE — Assessment & Plan Note (Signed)
Checking CMP today. BP is at goal home readings and close to goal here. Taking lisinopril 10 mg daily and amlodipine 10 mg daily and metoprolol 100 mg daily. No diabetes.

## 2021-01-19 NOTE — Assessment & Plan Note (Signed)
Checking lipid panel. 

## 2021-01-19 NOTE — Progress Notes (Signed)
Subjective:   Patient ID: Sara Rice, female    DOB: April 22, 1946, 75 y.o.   MRN: [id number]  HPI Here for medicare wellness and physical, no new complaints. Please see A/P for status and treatment of chronic medical problems.   Diet: heart healthy Physical activity: sedentary Depression/mood screen: negative Hearing: intact to whispered voice, bilateral aids Visual acuity: grossly normal with lens, performs annual eye exam  ADLs: capable Fall risk: low Home safety: good Cognitive evaluation: intact to orientation, naming, recall and repetition EOL planning: adv directives discussed  [place] Visit from 01/19/2021 in [place] at [facility] Total Score 0       I have personally reviewed and have noted 1. The patient's medical and social history - reviewed today no changes 2. Their use of alcohol, tobacco or illicit drugs 3. Their current medications and supplements 4. The patient's functional ability including ADL's, fall risks, home safety risks and hearing or visual impairment. 5. Diet and physical activities 6. Evidence for depression or mood disorders 7. Care team reviewed and updated 8.  The patient is not on an opioid pain medication.  Patient Care Team: Hoyt Koch, MD as PCP - General (Internal Medicine) Past Medical History:  Diagnosis Date   Anemia, unspecified    Benign neoplasm of colon    Calculus of kidney    HEMORRHOIDS 05/26/2009   Osteoarthrosis, unspecified whether generalized or localized, unspecified site    Other and unspecified hyperlipidemia    Other symptoms involving cardiovascular system    Unspecified essential hypertension    Unspecified hemorrhoids without mention of complication    Unspecified vitamin D deficiency    Past Surgical History:  Procedure Laterality Date   COLONOSCOPY     DENTAL SURGERY     Family History  Problem Relation Age of Onset   Diabetes Mother    Hypertension Mother     Colon polyps Mother    Stroke Brother        late 52s, nonsmoker   Hypertension Brother    Hypertension Sister        hx of obesity   Colon cancer Neg Hx    Review of Systems  Constitutional: Negative.   HENT: Negative.    Eyes: Negative.   Respiratory:  Negative for cough, chest tightness and shortness of breath.   Cardiovascular:  Negative for chest pain, palpitations and leg swelling.  Gastrointestinal:  Negative for abdominal distention, abdominal pain, constipation, diarrhea, nausea and vomiting.  Musculoskeletal: Negative.   Skin: Negative.   Neurological: Negative.   Psychiatric/Behavioral: Negative.     Objective:  Physical Exam Constitutional:      Appearance: She is well-developed.  HENT:     Head: Normocephalic and atraumatic.  Cardiovascular:     Rate and Rhythm: Normal rate and regular rhythm.  Pulmonary:     Effort: Pulmonary effort is normal. No respiratory distress.     Breath sounds: Normal breath sounds. No wheezing or rales.  Abdominal:     General: Bowel sounds are normal. There is no distension.     Palpations: Abdomen is soft.     Tenderness: There is no abdominal tenderness. There is no rebound.  Musculoskeletal:     Cervical back: Normal range of motion.  Skin:    General: Skin is warm and dry.  Neurological:     Mental Status: She is alert and oriented to person, place, and time.     Coordination: Coordination normal.  Vitals:   01/19/21 0955  BP: (!) 144/78  Pulse: 91  Temp: 98.4 F (36.9 C)  TempSrc: Oral  SpO2: 95%  Weight: 167 lb (75.8 kg)  Height: 5\' 3"  (1.6 m)   This visit occurred during the SARS-CoV-2 public health emergency.  Safety protocols were in place, including screening questions prior to the visit, additional usage of staff PPE, and extensive cleaning of exam room while observing appropriate contact time as indicated for disinfecting solutions.   Assessment & Plan:

## 2021-01-19 NOTE — Assessment & Plan Note (Signed)
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

## 2021-01-20 ENCOUNTER — Other Ambulatory Visit: Payer: Self-pay | Admitting: Internal Medicine

## 2021-01-20 DIAGNOSIS — N1832 Chronic kidney disease, stage 3b: Secondary | ICD-10-CM

## 2021-01-26 ENCOUNTER — Other Ambulatory Visit: Payer: Self-pay | Admitting: Internal Medicine

## 2021-04-06 ENCOUNTER — Other Ambulatory Visit: Payer: Self-pay | Admitting: Internal Medicine

## 2021-10-21 IMAGING — MG DIGITAL SCREENING BILAT W/ TOMO W/ CAD
8 series · 8 of 24 positions shown · non-contrast
Comparison: Previous exam(s).

CLINICAL DATA: Screening.

EXAM:
DIGITAL SCREENING BILATERAL MAMMOGRAM WITH TOMO AND CAD

[R CC synth-2D]
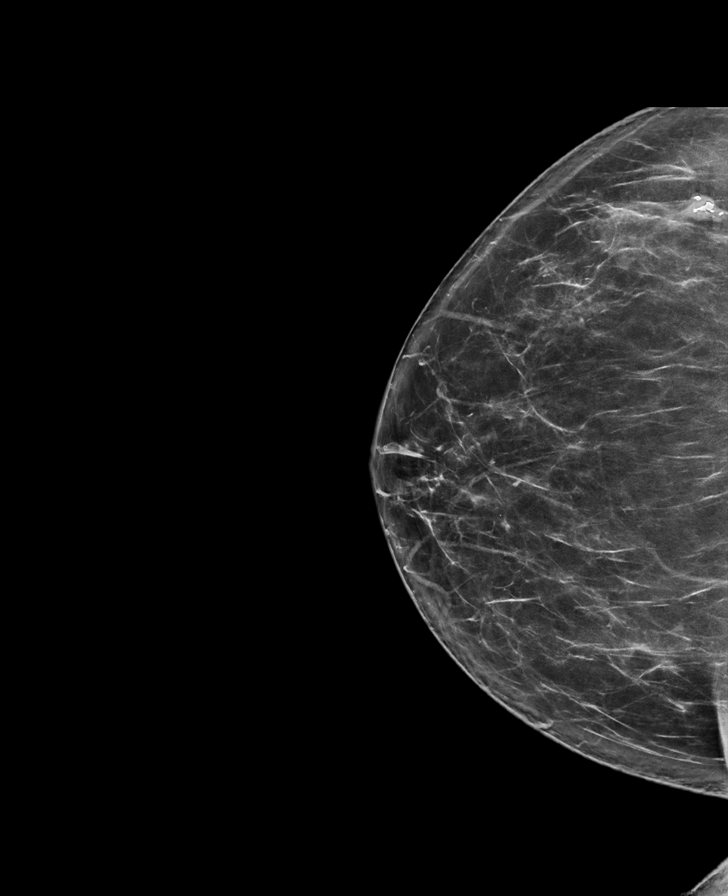

[R MLO synth-2D]
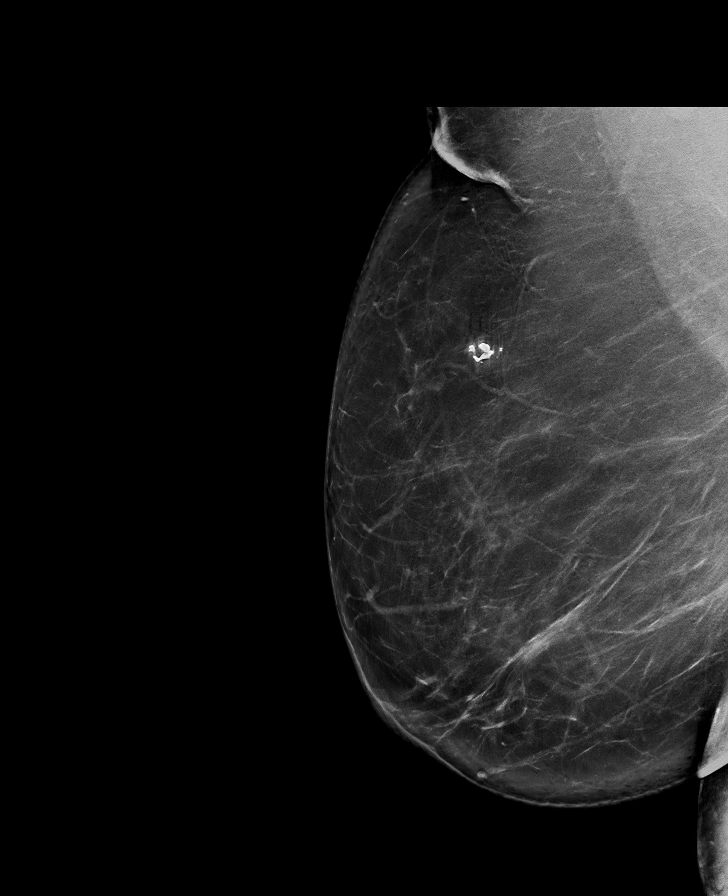

[L MLO synth-2D]
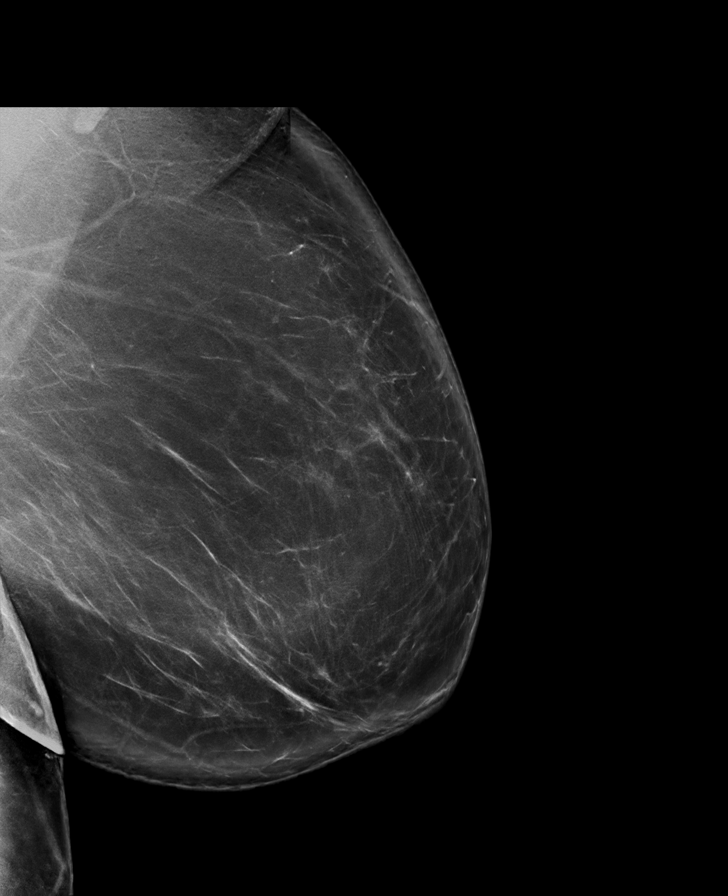

[L CC synth-2D]
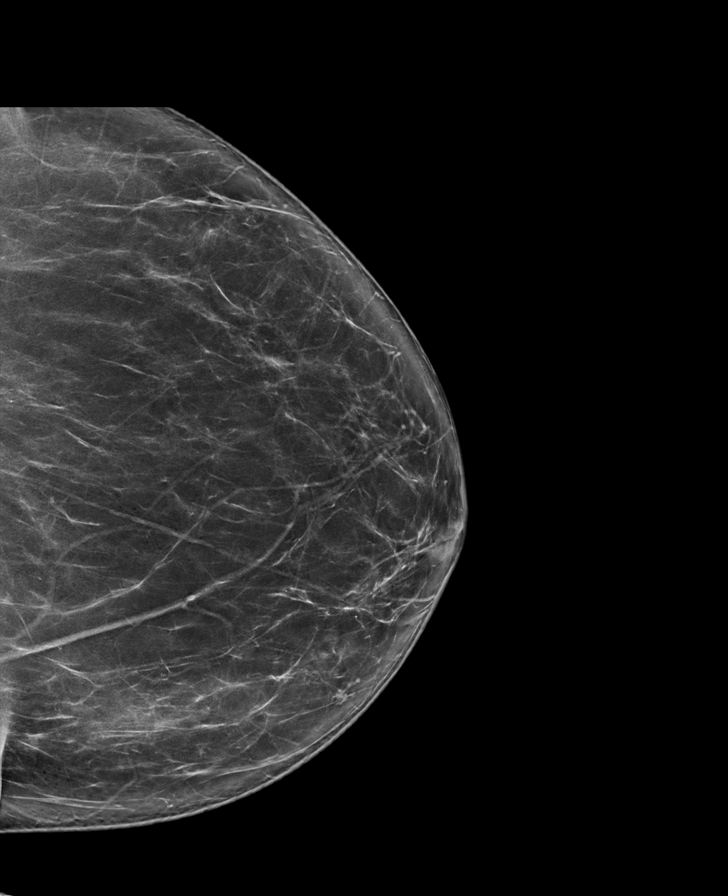

[R MLO tomo · tomo slice 53/105.0]
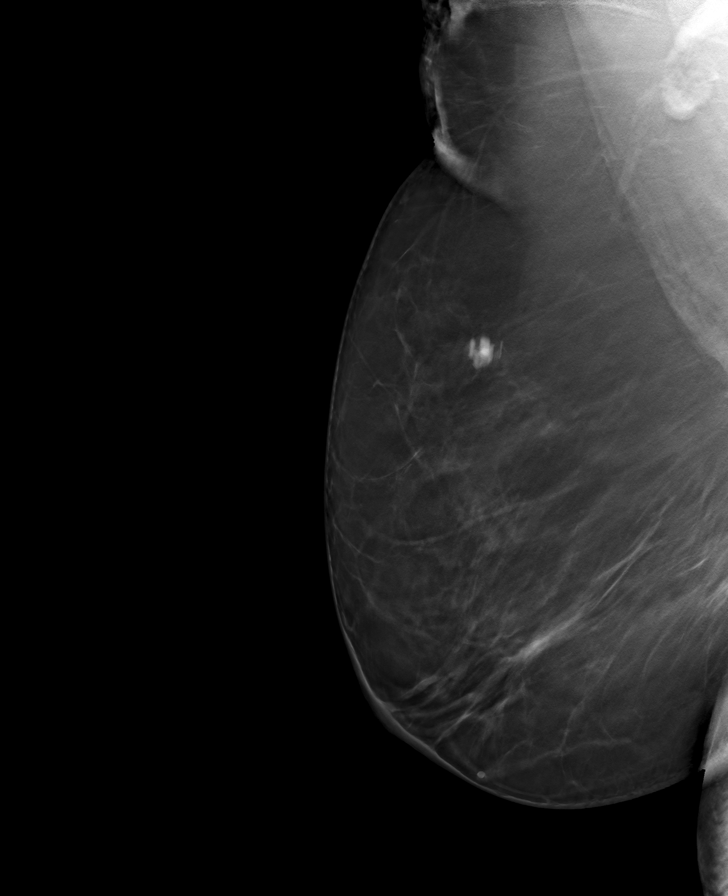

[R CC tomo · tomo slice 43/84.0]
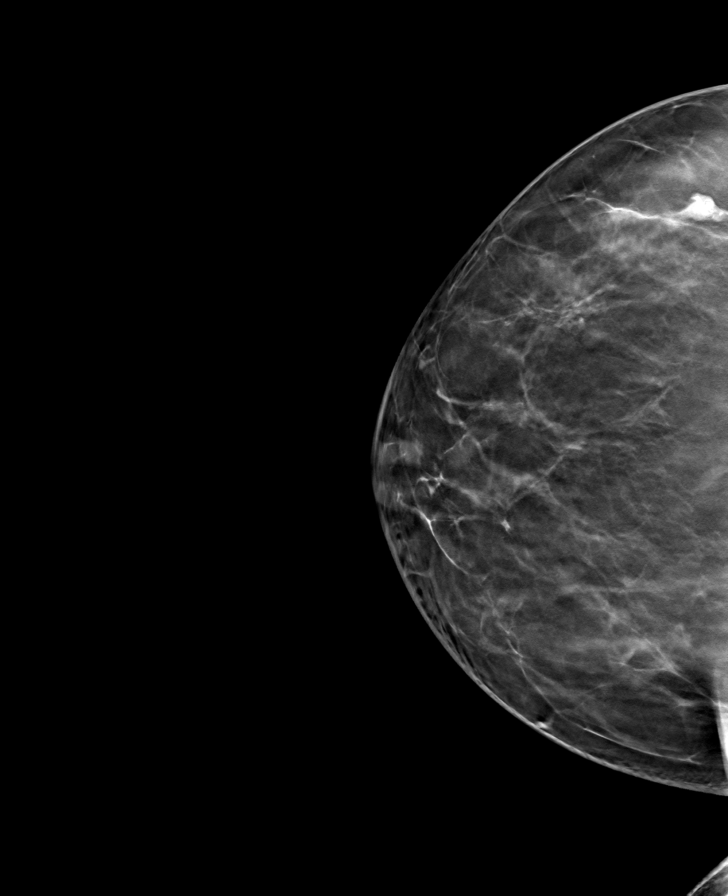

[L CC tomo · tomo slice 43/86.0]
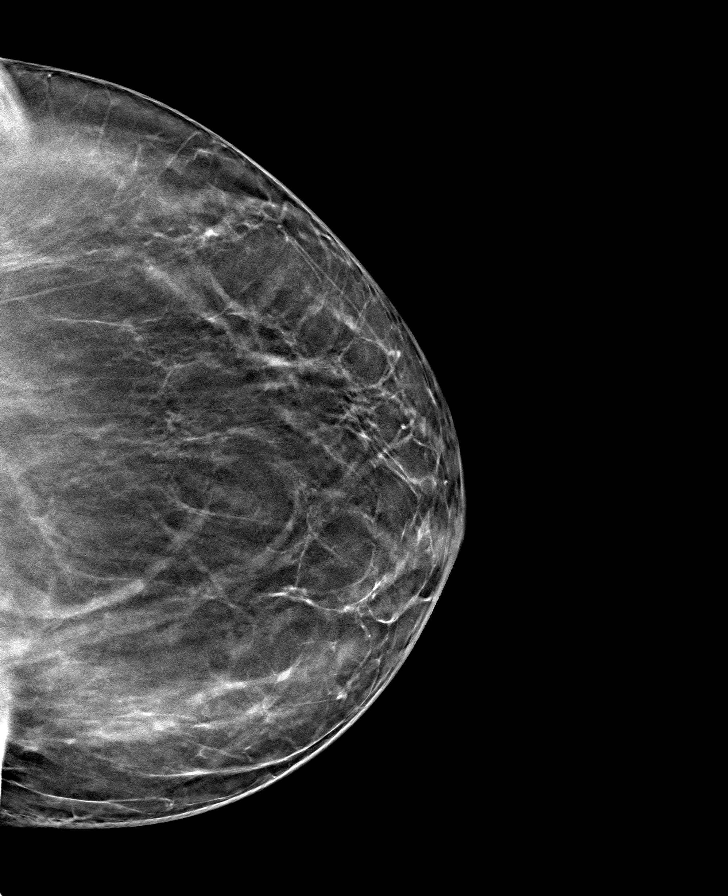

[L MLO tomo · tomo slice 55/110.0]
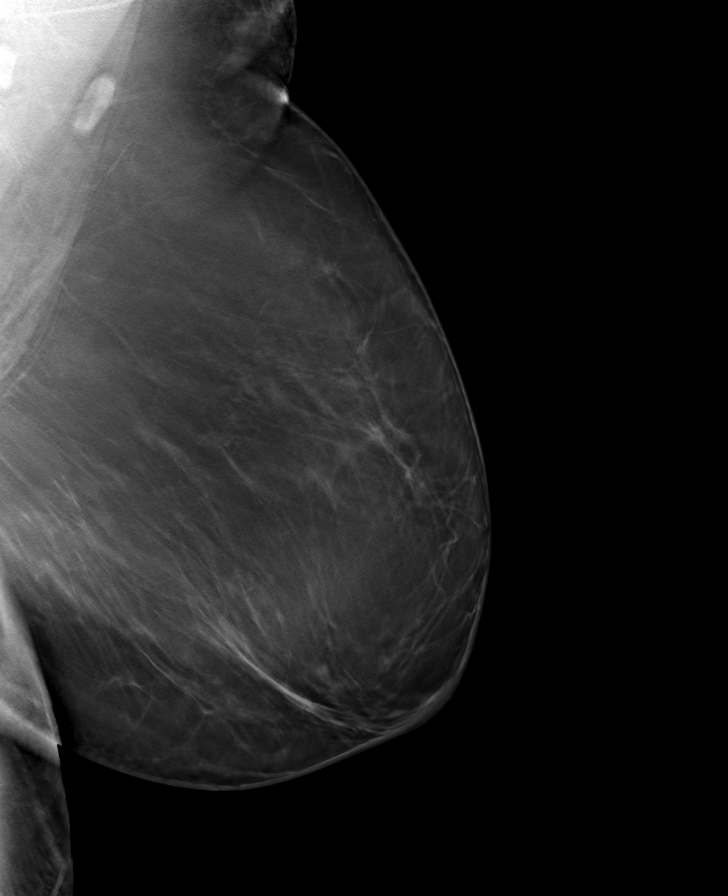

[8 of 24 positions shown; findings below may reference images not displayed]

ACR Breast Density Category b: There are scattered areas of
fibroglandular density.
FINDINGS: There are no findings suspicious for malignancy. Images were
processed with CAD.
IMPRESSION: No mammographic evidence of malignancy. A result letter of this
screening mammogram will be mailed directly to the patient.

RECOMMENDATION:
Screening mammogram in one year. (Code:[id number])

BI-RADS CATEGORY  1: Negative.

## 2021-11-30 ENCOUNTER — Telehealth: Payer: Self-pay | Admitting: Internal Medicine

## 2021-11-30 DIAGNOSIS — N1831 Chronic kidney disease, stage 3a: Secondary | ICD-10-CM

## 2021-11-30 NOTE — Telephone Encounter (Signed)
Spoke with the patient and she has been scheduled to have blood work done tomorrow at 12/01/2021 at 11 am. Patient is aware of her appt day and time.  ?

## 2021-11-30 NOTE — Telephone Encounter (Signed)
Put in referral as well as labs since they will likely want updated labs before they accept the referral so she can do that anytime. ?

## 2021-11-30 NOTE — Telephone Encounter (Signed)
Pt states she tried to schedule an appt w/ [facility] and was advised another referral is needed due to her not making an appt in the last 10 mos ? ?Pt inquiring if another referral can be placed ? ?Please advise ? ? ?

## 2021-12-01 ENCOUNTER — Other Ambulatory Visit (INDEPENDENT_AMBULATORY_CARE_PROVIDER_SITE_OTHER): Payer: Medicare Other

## 2021-12-01 DIAGNOSIS — N1831 Chronic kidney disease, stage 3a: Secondary | ICD-10-CM

## 2021-12-01 LAB — COMPREHENSIVE METABOLIC PANEL
ALT: 19 U/L (ref 0–35)
AST: 18 U/L (ref 0–37)
Albumin: 4.2 g/dL (ref 3.5–5.2)
Alkaline Phosphatase: 88 U/L (ref 39–117)
BUN: 27 mg/dL — ABNORMAL HIGH (ref 6–23)
CO2: 28 mEq/L (ref 19–32)
Calcium: 9.9 mg/dL (ref 8.4–10.5)
Chloride: 106 mEq/L (ref 96–112)
Creatinine, Ser: 1.66 mg/dL — ABNORMAL HIGH (ref 0.40–1.20)
GFR: 30.3 mL/min — ABNORMAL LOW (ref >60.00)
Glucose, Bld: 98 mg/dL (ref 70–99)
Potassium: 4.3 mEq/L (ref 3.5–5.1)
Sodium: 139 mEq/L (ref 135–145)
Total Bilirubin: 0.3 mg/dL (ref 0.2–1.2)
Total Protein: 8.9 g/dL — ABNORMAL HIGH (ref 6.0–8.3)

## 2022-01-01 ENCOUNTER — Other Ambulatory Visit: Payer: Self-pay | Admitting: Internal Medicine

## 2022-01-20 ENCOUNTER — Ambulatory Visit (INDEPENDENT_AMBULATORY_CARE_PROVIDER_SITE_OTHER): Payer: Medicare Other

## 2022-01-20 DIAGNOSIS — Z0000 Encounter for general adult medical examination without abnormal findings: Secondary | ICD-10-CM | POA: Diagnosis not present

## 2022-01-20 NOTE — Patient Instructions (Addendum)
[name] , Thank you for taking time to come for your Medicare Wellness Visit. I appreciate your ongoing commitment to your health goals. Please review the following plan we discussed and let me know if I can assist you in the future.   Screening recommendations/referrals: Colonoscopy: 09/29/2015; due every 5 years (overdue) Mammogram: 09/25/2019; due every 1-2 years Bone Density: 01/22/2015; due every 5 years Recommended yearly ophthalmology/optometry visit for glaucoma screening and checkup Recommended yearly dental visit for hygiene and checkup  Vaccinations: Influenza vaccine: due every Fall Season Pneumococcal vaccine: 06/25/2014, 06/25/2014 Tdap vaccine: 01/04/2017; due every 10 years Shingles vaccine: never done   Covid-19: 10/14/2019, 06/07/2020, 12/06/2020, 04/18/2021  Advanced directives: No  Conditions/risks identified: Yes  Next appointment: Please schedule your next Medicare Wellness Visit with your Nurse Health Advisor in 1 year by calling [phone].   Preventive Care 47 Years and Older, Female Preventive care refers to lifestyle choices and visits with your health care provider that can promote health and wellness. What does preventive care include? A yearly physical exam. This is also called an annual well check. Dental exams once or twice a year. Routine eye exams. Ask your health care provider how often you should have your eyes checked. Personal lifestyle choices, including: Daily care of your teeth and gums. Regular physical activity. Eating a healthy diet. Avoiding tobacco and drug use. Limiting alcohol use. Practicing safe sex. Taking low-dose aspirin every day. Taking vitamin and mineral supplements as recommended by your health care provider. What happens during an annual well check? The services and screenings done by your health care provider during your annual well check will depend on your age, overall health, lifestyle risk factors, and family history  of disease. Counseling  Your health care provider may ask you questions about your: Alcohol use. Tobacco use. Drug use. Emotional well-being. Home and relationship well-being. Sexual activity. Eating habits. History of falls. Memory and ability to understand (cognition). Work and work Statistician. Reproductive health. Screening  You may have the following tests or measurements: Height, weight, and BMI. Blood pressure. Lipid and cholesterol levels. These may be checked every 5 years, or more frequently if you are over 35 years old. Skin check. Lung cancer screening. You may have this screening every year starting at age 41 if you have a 30-pack-year history of smoking and currently smoke or have quit within the past 15 years. Fecal occult blood test (FOBT) of the stool. You may have this test every year starting at age 60. Flexible sigmoidoscopy or colonoscopy. You may have a sigmoidoscopy every 5 years or a colonoscopy every 10 years starting at age 76. Hepatitis C blood test. Hepatitis B blood test. Sexually transmitted disease (STD) testing. Diabetes screening. This is done by checking your blood sugar (glucose) after you have not eaten for a while (fasting). You may have this done every 1-3 years. Bone density scan. This is done to screen for osteoporosis. You may have this done starting at age 86. Mammogram. This may be done every 1-2 years. Talk to your health care provider about how often you should have regular mammograms. Talk with your health care provider about your test results, treatment options, and if necessary, the need for more tests. Vaccines  Your health care provider may recommend certain vaccines, such as: Influenza vaccine. This is recommended every year. Tetanus, diphtheria, and acellular pertussis (Tdap, Td) vaccine. You may need a Td booster every 10 years. Zoster vaccine. You may need this after age 68. Pneumococcal 13-valent  conjugate (PCV13) vaccine. One  dose is recommended after age 45. Pneumococcal polysaccharide (PPSV23) vaccine. One dose is recommended after age 109. Talk to your health care provider about which screenings and vaccines you need and how often you need them. This information is not intended to replace advice given to you by your health care provider. Make sure you discuss any questions you have with your health care provider. Document Released: 08/15/2015 Document Revised: 04/07/2016 Document Reviewed: 05/20/2015 Elsevier Interactive Patient Education  2017 [place] Prevention in the Home Falls can cause injuries. They can happen to people of all ages. There are many things you can do to make your home safe and to help prevent falls. What can I do on the outside of my home? Regularly fix the edges of walkways and driveways and fix any cracks. Remove anything that might make you trip as you walk through a door, such as a raised step or threshold. Trim any bushes or trees on the path to your home. Use bright outdoor lighting. Clear any walking paths of anything that might make someone trip, such as rocks or tools. Regularly check to see if handrails are loose or broken. Make sure that both sides of any steps have handrails. Any raised decks and porches should have guardrails on the edges. Have any leaves, snow, or ice cleared regularly. Use sand or salt on walking paths during winter. Clean up any spills in your garage right away. This includes oil or grease spills. What can I do in the bathroom? Use night lights. Install grab bars by the toilet and in the tub and shower. Do not use towel bars as grab bars. Use non-skid mats or decals in the tub or shower. If you need to sit down in the shower, use a plastic, non-slip stool. Keep the floor dry. Clean up any water that spills on the floor as soon as it happens. Remove soap buildup in the tub or shower regularly. Attach bath mats securely with double-sided  non-slip rug tape. Do not have throw rugs and other things on the floor that can make you trip. What can I do in the bedroom? Use night lights. Make sure that you have a light by your bed that is easy to reach. Do not use any sheets or blankets that are too big for your bed. They should not hang down onto the floor. Have a firm chair that has side arms. You can use this for support while you get dressed. Do not have throw rugs and other things on the floor that can make you trip. What can I do in the kitchen? Clean up any spills right away. Avoid walking on wet floors. Keep items that you use a lot in easy-to-reach places. If you need to reach something above you, use a strong step stool that has a grab bar. Keep electrical cords out of the way. Do not use floor polish or wax that makes floors slippery. If you must use wax, use non-skid floor wax. Do not have throw rugs and other things on the floor that can make you trip. What can I do with my stairs? Do not leave any items on the stairs. Make sure that there are handrails on both sides of the stairs and use them. Fix handrails that are broken or loose. Make sure that handrails are as long as the stairways. Check any carpeting to make sure that it is firmly attached to the stairs. Fix any carpet that  is loose or worn. Avoid having throw rugs at the top or bottom of the stairs. If you do have throw rugs, attach them to the floor with carpet tape. Make sure that you have a light switch at the top of the stairs and the bottom of the stairs. If you do not have them, ask someone to add them for you. What else can I do to help prevent falls? Wear shoes that: Do not have high heels. Have rubber bottoms. Are comfortable and fit you well. Are closed at the toe. Do not wear sandals. If you use a stepladder: Make sure that it is fully opened. Do not climb a closed stepladder. Make sure that both sides of the stepladder are locked into place. Ask  someone to hold it for you, if possible. Clearly mark and make sure that you can see: Any grab bars or handrails. First and last steps. Where the edge of each step is. Use tools that help you move around (mobility aids) if they are needed. These include: Canes. Walkers. Scooters. Crutches. Turn on the lights when you go into a dark area. Replace any light bulbs as soon as they burn out. Set up your furniture so you have a clear path. Avoid moving your furniture around. If any of your floors are uneven, fix them. If there are any pets around you, be aware of where they are. Review your medicines with your doctor. Some medicines can make you feel dizzy. This can increase your chance of falling. Ask your doctor what other things that you can do to help prevent falls. This information is not intended to replace advice given to you by your health care provider. Make sure you discuss any questions you have with your health care provider. Document Released: 05/15/2009 Document Revised: 12/25/2015 Document Reviewed: 08/23/2014 Elsevier Interactive Patient Education  2017 [employer].

## 2022-01-20 NOTE — Progress Notes (Addendum)
I connected with Sara Rice today by telephone and verified that I am speaking with the correct person using two identifiers. Location patient: home Location provider: work Persons participating in the virtual visit: patient, provider.   I discussed the limitations, risks, security and privacy concerns of performing an evaluation and management service by telephone and the availability of in person appointments. I also discussed with the patient that there may be a patient responsible charge related to this service. The patient expressed understanding and verbally consented to this telephonic visit.    Interactive audio and video telecommunications were attempted between this provider and patient, however failed, due to patient having technical difficulties OR patient did not have access to video capability.  We continued and completed visit with audio only.  Some vital signs may be absent or patient reported.   Time Spent with patient on telephone encounter: 30 minutes  Subjective:   Sara Rice is a 76 y.o. female who presents for Medicare Annual (Subsequent) preventive examination.  Review of Systems     Cardiac Risk Factors include: advanced age (>67mn, >>36women);family history of premature cardiovascular disease;hypertension     Objective:    There were no vitals filed for this visit. There is no height or weight on file to calculate BMI.     01/20/2022   12:34 PM 09/29/2015   10:06 AM 09/15/2015    9:57 AM 09/15/2015    9:56 AM  Advanced Directives  Does Patient Have a Medical Advance Directive? No No No Yes  Type of AScientist, research (medical)Living will  Does patient want to make changes to medical advance directive?   No - Patient declined No - Patient declined  Copy of [place]?   No - copy requested No - copy requested  Would patient like information on creating a medical advance directive? No - Patient  declined  No - patient declined information     Current Medications (verified) Outpatient Encounter Medications as of 01/20/2022  Medication Sig   amLODipine (NORVASC) 10 MG tablet TAKE 1 TABLET(10 MG) BY MOUTH DAILY   aspirin 81 MG tablet Take 81 mg by mouth daily.     cholecalciferol (VITAMIN D) 1000 UNITS tablet Take 1,000 Units by mouth daily.     Ferrous Sulfate (IRON) 325 (65 FE) MG TABS Take 1 tablet by mouth daily.   lisinopril (ZESTRIL) 10 MG tablet TAKE 1 TABLET(10 MG) BY MOUTH DAILY   metoprolol succinate (TOPROL-XL) 100 MG 24 hr tablet Take 1 tablet (100 mg total) by mouth daily. Annual appt is due w/labs must see provider for future refills   No facility-administered encounter medications on file as of 01/20/2022.    Allergies (verified) Patient has no known allergies.   History: Past Medical History:  Diagnosis Date   Anemia, unspecified    Benign neoplasm of colon    Calculus of kidney    HEMORRHOIDS 05/26/2009   Osteoarthrosis, unspecified whether generalized or localized, unspecified site    Other and unspecified hyperlipidemia    Other symptoms involving cardiovascular system    Unspecified essential hypertension    Unspecified hemorrhoids without mention of complication    Unspecified vitamin D deficiency    Past Surgical History:  Procedure Laterality Date   COLONOSCOPY     DENTAL SURGERY     Family History  Problem Relation Age of Onset   Diabetes Mother    Hypertension Mother  Colon polyps Mother    Stroke Brother        late 46s, nonsmoker   Hypertension Brother    Hypertension Sister        hx of obesity   Colon cancer Neg Hx    Social History   Socioeconomic History   Marital status: Legally Separated    Spouse name: Not on file   Number of children: 2   Years of education: Not on file   Highest education level: Not on file  Occupational History   Occupation:  country club    Employer: [employer]  Tobacco Use    Smoking status: Never   Smokeless tobacco: Never  Substance and Sexual Activity   Alcohol use: No   Drug use: No   Sexual activity: Not on file  Other Topics Concern   Not on file  Social History Narrative   Widowed in 2012 (rectal cancer-they were not together at that time). 2 kids. 1 grandson.       Retired from [employer] then returned to work 4 hours per day      Hobbies: shopping, reading, relaxing, gardening   Social Determinants of Health   Financial Resource Strain: Low Risk  (01/20/2022)   Overall Financial Resource Strain (CARDIA)    Difficulty of Paying Living Expenses: Not hard at all  Food Insecurity: No Food Insecurity (01/20/2022)   Hunger Vital Sign    Worried About Running Out of Food in the Last Year: Never true    [place] in the Last Year: Never true  Transportation Needs: No Transportation Needs (01/20/2022)   PRAPARE - Hydrologist (Medical): No    Lack of Transportation (Non-Medical): No  Physical Activity: Sufficiently Active (01/20/2022)   Exercise Vital Sign    Days of Exercise per Week: 5 days    Minutes of Exercise per Session: 30 min  Stress: No Stress Concern Present (01/20/2022)   [place]    Feeling of Stress : Not at all  Social Connections: Moderately Integrated (01/20/2022)   Social Connection and Isolation Panel [NHANES]    Frequency of Communication with Friends and Family: More than three times a week    Frequency of Social Gatherings with Friends and Family: Once a week    Attends Religious Services: 1 to 4 times per year    Active Member of [employer] or Organizations: No    Attends Music therapist: 1 to 4 times per year    Marital Status: Separated    Tobacco Counseling Counseling given: Not Answered   Clinical Intake:  Pre-visit preparation completed: Yes  Pain : No/denies pain     BMI - recorded:  29.59 Nutritional Risks: None Diabetes: No  How often do you need to have someone help you when you read instructions, pamphlets, or other written materials from your doctor or pharmacy?: 1 - Never What is the last grade level you completed in school?: HSG  Diabetic? no  Interpreter Needed?: No  Information entered by :: Lisette Abu, LPN.   Activities of Daily Living    01/20/2022   12:38 PM  In your present state of health, do you have any difficulty performing the following activities:  Hearing? 0  Vision? 0  Difficulty concentrating or making decisions? 0  Walking or climbing stairs? 0  Dressing or bathing? 0  Doing errands, shopping? 0  Preparing Food and  eating ? N  Using the Toilet? N  In the past six months, have you accidently leaked urine? N  Do you have problems with loss of bowel control? N  Managing your Medications? N  Managing your Finances? N  Housekeeping or managing your Housekeeping? N    Patient Care Team: Hoyt Koch, MD as PCP - General (Internal Medicine)  Indicate any recent Medical Services you may have received from other than [facility] providers in the past year (date may be approximate).     Assessment:   This is a routine wellness examination for [place].  Hearing/Vision screen Hearing Screening - Comments:: Patient wears hearing aids. Vision Screening - Comments:: Patient wears eyeglasses. Eye exam done by: Madilyn Hook, OD at [facility]  Dietary issues and exercise activities discussed: Current Exercise Habits: Home exercise routine, Type of exercise: walking, Time (Minutes): 30, Frequency (Times/Week): 5, Weekly Exercise (Minutes/Week): 150, Intensity: Moderate, Exercise limited by: None identified   Goals Addressed             This Visit's Progress    To stay healthy and motivated.        Depression Screen    01/20/2022   12:38 PM 01/19/2021    1:03 PM 01/18/2020    9:57 AM 01/16/2019    1:47 PM 07/14/2018     9:27 AM 07/06/2017    9:49 AM 05/26/2016    8:22 AM  PHQ 2/9 Scores  PHQ - 2 Score 0 0 0 1 0 0 0    Fall Risk    01/20/2022   12:35 PM 01/19/2021    9:57 AM 01/18/2020    9:57 AM 01/16/2019    1:46 PM 07/14/2018    9:27 AM  Fall Risk   Falls in the past year? 0 1 0 0 0  Number falls in past yr: 0 0     Injury with Fall? 0 1     Risk for fall due to : No Fall Risks No Fall Risks     Follow up Falls evaluation completed Falls evaluation completed       [place]:  Any stairs in or around the home? No  If so, are there any without handrails? No  Home free of loose throw rugs in walkways, pet beds, electrical cords, etc? Yes  Adequate lighting in your home to reduce risk of falls? Yes   ASSISTIVE DEVICES UTILIZED TO PREVENT FALLS:  Life alert? No  Use of a cane, walker or w/c? No  Grab bars in the bathroom? No  Shower chair or bench in shower? No  Elevated toilet seat or a handicapped toilet? No   TIMED UP AND GO:  Was the test performed? No .  Length of time to ambulate 10 feet: n/a sec.   Appearance of gait: Patient not evaluated for gait during this visit.  Cognitive Function:        01/20/2022   12:42 PM  6CIT Screen  What Year? 0 points  What month? 0 points  What time? 0 points  Count back from 20 0 points  Months in reverse 0 points  Repeat phrase 0 points  Total Score 0 points    Immunizations Immunization History  Administered Date(s) Administered   Influenza Split 05/31/2011, 05/29/2012   Influenza Whole 06/18/2009, 05/02/2010   Influenza, High Dose Seasonal PF 05/26/2016, 07/06/2017, 07/14/2018   Influenza,inj,Quad PF,6+ Mos 06/18/2013, 05/14/2014, 04/29/2015   PFIZER Comirnaty(Gray Top)Covid-19 Tri-Sucrose Vaccine 12/06/2020  PFIZER(Purple Top)SARS-COV-2 Vaccination 10/14/2019, 11/05/2019, 06/07/2020   Pfizer Covid-19 Vaccine Bivalent Booster 22yr & up 04/08/2021   Pneumococcal Conjugate-13 06/25/2014    Pneumococcal Polysaccharide-23 11/25/2015   Tdap 01/04/2017    TDAP status: Up to date  Flu Vaccine status: Due, Education has been provided regarding the importance of this vaccine. Advised may receive this vaccine at local pharmacy or Health Dept. Aware to provide a copy of the vaccination record if obtained from local pharmacy or Health Dept. Verbalized acceptance and understanding.  Pneumococcal vaccine status: Up to date  Covid-19 vaccine status: Completed vaccines  Qualifies for Shingles Vaccine? Yes   Zostavax completed No   Shingrix Completed?: No.    Education has been provided regarding the importance of this vaccine. Patient has been advised to call insurance company to determine out of pocket expense if they have not yet received this vaccine. Advised may also receive vaccine at local pharmacy or Health Dept. Verbalized acceptance and understanding.  Screening Tests Health Maintenance  Topic Date Due   Zoster Vaccines- Shingrix (1 of 2) Never done   COLONOSCOPY (Pts 45-45yrInsurance coverage will need to be confirmed)  09/28/2020   COVID-19 Vaccine (6 - Pfizer series) 08/08/2021   MAMMOGRAM  09/24/2021   Hepatitis C Screening  07/30/2098 (Originally 10/31/1963)   INFLUENZA VACCINE  03/02/2022   TETANUS/TDAP  01/05/2027   Pneumonia Vaccine 6522Years old  Completed   DEXA SCAN  Completed   HPV VACCINES  Aged Out    Health Maintenance  Health Maintenance Due  Topic Date Due   Zoster Vaccines- Shingrix (1 of 2) Never done   COLONOSCOPY (Pts 45-4941yrnsurance coverage will need to be confirmed)  09/28/2020   COVID-19 Vaccine (6 - Pfizer series) 08/08/2021   MAMMOGRAM  09/24/2021    Colorectal cancer screening: Type of screening: Colonoscopy. Completed 09/29/2015. Repeat every 5 years  Mammogram status: Completed 09/25/2019. Repeat every year  Bone Density status: Completed 01/22/2015. Results reflect: Bone density results: NORMAL. Repeat every 5 years.  Lung Cancer  Screening: (Low Dose CT Chest recommended if Age 74-68-80ars, 30 pack-year currently smoking OR have quit w/in 15years.) does not qualify.   Lung Cancer Screening Referral: no  Additional Screening:  Hepatitis C Screening: does qualify; Completed no  Vision Screening: Recommended annual ophthalmology exams for early detection of glaucoma and other disorders of the eye. Is the patient up to date with their annual eye exam?  Yes  Who is the provider or what is the name of the office in which the patient attends annual eye exams? [facility]. If pt is not established with a provider, would they like to be referred to a provider to establish care? No .   Dental Screening: Recommended annual dental exams for proper oral hygiene  Community Resource Referral / Chronic Care Management: CRR required this visit?  No   CCM required this visit?  No      Plan:     I have personally reviewed and noted the following in the patient's chart:   Medical and social history Use of alcohol, tobacco or illicit drugs  Current medications and supplements including opioid prescriptions.  Functional ability and status Nutritional status Physical activity Advanced directives List of other physicians Hospitalizations, surgeries, and ER visits in previous 12 months Vitals Screenings to include cognitive, depression, and falls Referrals and appointments  In addition, I have reviewed and discussed with patient certain preventive protocols, quality metrics, and best practice recommendations. A written  personalized care plan for preventive services as well as general preventive health recommendations were provided to patient.     Sheral Flow, LPN   0/37/0964   Nurse Notes:  There were no vitals filed for this visit. There is no height or weight on file to calculate BMI. Patient stated that she has no issues with gait or balance; does not use any assistive devices. Medications reviewed  with patient; no opioid use noted.    Medical screening examination/treatment/procedure(s) were performed by non-physician practitioner and as supervising physician I was immediately available for consultation/collaboration.  I agree with above. Lew Dawes, MD

## 2022-01-25 ENCOUNTER — Ambulatory Visit (INDEPENDENT_AMBULATORY_CARE_PROVIDER_SITE_OTHER): Payer: Medicare Other | Admitting: Internal Medicine

## 2022-01-25 ENCOUNTER — Encounter: Payer: Self-pay | Admitting: Internal Medicine

## 2022-01-25 VITALS — BP 124/64 | HR 67 | Resp 18 | Ht 62.0 in | Wt 171.0 lb

## 2022-01-25 DIAGNOSIS — I10 Essential (primary) hypertension: Secondary | ICD-10-CM

## 2022-01-25 DIAGNOSIS — N1832 Chronic kidney disease, stage 3b: Secondary | ICD-10-CM

## 2022-01-25 DIAGNOSIS — E782 Mixed hyperlipidemia: Secondary | ICD-10-CM

## 2022-01-25 DIAGNOSIS — Z0000 Encounter for general adult medical examination without abnormal findings: Secondary | ICD-10-CM | POA: Diagnosis not present

## 2022-01-25 NOTE — Progress Notes (Signed)
   Subjective:   Patient ID: Sara Rice, female    DOB: [birth year], 76 y.o.   MRN: [id number]  HPI The patient is here for physical.  PMH, [facility], social history reviewed and updated  Review of Systems  Constitutional: Negative.   HENT: Negative.    Eyes: Negative.   Respiratory:  Negative for cough, chest tightness and shortness of breath.   Cardiovascular:  Negative for chest pain, palpitations and leg swelling.  Gastrointestinal:  Negative for abdominal distention, abdominal pain, constipation, diarrhea, nausea and vomiting.  Musculoskeletal: Negative.   Skin: Negative.   Neurological: Negative.   Psychiatric/Behavioral: Negative.      Objective:  Physical Exam Constitutional:      Appearance: She is well-developed.  HENT:     Head: Normocephalic and atraumatic.  Cardiovascular:     Rate and Rhythm: Normal rate and regular rhythm.  Pulmonary:     Effort: Pulmonary effort is normal. No respiratory distress.     Breath sounds: Normal breath sounds. No wheezing or rales.  Abdominal:     General: Bowel sounds are normal. There is no distension.     Palpations: Abdomen is soft.     Tenderness: There is no abdominal tenderness. There is no rebound.  Musculoskeletal:     Cervical back: Normal range of motion.  Skin:    General: Skin is warm and dry.  Neurological:     Mental Status: She is alert and oriented to person, place, and time.     Coordination: Coordination normal.     Vitals:   01/25/22 1031  BP: 124/64  Pulse: 67  Resp: 18  SpO2: 98%  Weight: 171 lb (77.6 kg)  Height: '5\' 2"'$  (1.575 m)    Assessment & Plan:

## 2022-01-28 NOTE — Assessment & Plan Note (Signed)
Flu shot yearly. Covid-19 counseledd. Pneumonia complete. Shingrix counseled. Tetanus up to date. Colonoscopy overdue. Mammogram up to date, pap smear aged out and dexa complete. Counseled about sun safety and mole surveillance. Counseled about the dangers of distracted driving. Given 10 year screening recommendations.

## 2022-01-28 NOTE — Assessment & Plan Note (Signed)
BP at goal on lisinopril 10 mg daily and metoprolol 100 mg daily and amlodipine 10 mg daily. Checking CMP and adjust as needed.

## 2022-01-28 NOTE — Assessment & Plan Note (Signed)
Checking lipid panel and adjust as needed. Not on statin.

## 2022-01-28 NOTE — Assessment & Plan Note (Signed)
Checking CMP today and discussed May levels and encouraged to see nephrology (referral placed in May). BP at goal.

## 2022-01-31 ENCOUNTER — Other Ambulatory Visit: Payer: Self-pay | Admitting: Internal Medicine

## 2022-02-01 ENCOUNTER — Other Ambulatory Visit: Payer: Self-pay | Admitting: Internal Medicine

## 2022-04-01 ENCOUNTER — Other Ambulatory Visit: Payer: Self-pay | Admitting: Internal Medicine

## 2022-05-01 ENCOUNTER — Other Ambulatory Visit: Payer: Self-pay | Admitting: Internal Medicine

## 2022-07-30 ENCOUNTER — Other Ambulatory Visit: Payer: Self-pay | Admitting: Internal Medicine

## 2022-11-17 ENCOUNTER — Telehealth: Payer: Self-pay

## 2022-11-17 NOTE — Patient Instructions (Signed)
Visit Information  Thank you for taking time to visit with me today. Please don't hesitate to contact me if I can be of assistance to you.   Following are the goals we discussed today:   Goals Addressed             This Visit's Progress    COMPLETED: Care Coordination Activities       Interventions Today    Flowsheet Row Most Recent Value  Chronic Disease   Chronic disease during today's visit Hypertension (HTN)  General Interventions   General Interventions Discussed/Reviewed General Interventions Discussed, Doctor Visits  [discussed care coordination program. 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 Discussed possible transportation benefit with [employer] and encouraged patient to call the number on the back of card to confirm if this is a part of her benefits.]  Nutrition Interventions   Nutrition Discussed/Reviewed Nutrition Discussed  [encuoraged to eat healthy avoid saturated fats]  Pharmacy Interventions   Pharmacy Dicussed/Reviewed Pharmacy Topics Discussed, Medications and their functions, Medication Adherence, Affording Medications  [reports has all her medications and is taking as prescribed.]            If you are experiencing a Mental  Health or Behavioral Health Crisis or need someone to talk to, please call the Suicide and Crisis Lifeline: 48  Kathyrn Sheriff, RN, MSN, BSN, CCM [facility] Care Coordinator [phone]

## 2022-11-17 NOTE — Patient Outreach (Signed)
  Care Coordination   Initial Visit Note   11/17/2022 Name: DESIA SABAN MRN: [id number] DOB: Apr 07, 1946  Karma Ganja Weldin is a 77 y.o. year old female who sees Myrlene Broker, MD for primary care. I spoke with  Odette Horns by phone today.  What matters to the patients health and wellness today?  Ms. 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 She decline social work referral adding she is doing ok with the adjusting and has hospice to follow up with if needed. She denies any care coordination, disease management or resource needs at this time.  Goals Addressed             This Visit's Progress    COMPLETED: Care Coordination Activities       Interventions Today    Flowsheet Row Most Recent Value  Chronic Disease   Chronic disease during today's visit Hypertension (HTN)  General Interventions   General Interventions Discussed/Reviewed General Interventions Discussed, Doctor Visits  [discussed care coordination program. 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 Discussed possible transportation benefit with [facility] and encouraged patient to call the number on the back of card to confirm if this is a part of her benefits.]  Nutrition Interventions   Nutrition Discussed/Reviewed Nutrition Discussed  [encuoraged to eat healthy avoid saturated fats]  Pharmacy Interventions   Pharmacy Dicussed/Reviewed Pharmacy Topics Discussed, Medications and their functions, Medication Adherence, Affording Medications  [reports has all her medications and is taking as prescribed.]            SDOH assessments and interventions completed:  Yes  SDOH Interventions Today    Flowsheet Row Most Recent Value  SDOH Interventions   Food Insecurity Interventions Intervention Not Indicated  Housing Interventions Intervention Not Indicated  Transportation Interventions Intervention Not Indicated  Utilities Interventions Intervention Not Indicated     Care Coordination Interventions:  Yes, provided   Follow up plan: No further intervention required.   Encounter Outcome:  Pt. Visit Completed   Kathyrn Sheriff, RN, MSN, BSN, CCM [facility] Care Coordinator [phone]

## 2022-12-31 ENCOUNTER — Other Ambulatory Visit: Payer: Self-pay | Admitting: Internal Medicine

## 2023-01-11 ENCOUNTER — Ambulatory Visit (INDEPENDENT_AMBULATORY_CARE_PROVIDER_SITE_OTHER): Payer: Medicare Other

## 2023-01-11 VITALS — Ht 62.0 in | Wt 171.0 lb

## 2023-01-11 DIAGNOSIS — Z0000 Encounter for general adult medical examination without abnormal findings: Secondary | ICD-10-CM

## 2023-01-11 NOTE — Patient Instructions (Addendum)
[name] , Thank you for taking time to come for your Medicare Wellness Visit. I appreciate your ongoing commitment to your health goals. Please review the following plan we discussed and let me know if I can assist you in the future.   These are the goals we discussed:  Goals       To stay healthy and motivated. (pt-stated)        This is a list of the screening recommended for you and due dates:  Health Maintenance  Topic Date Due   COVID-19 Vaccine (6 - 2023-24 season) 01/27/2023*   Zoster (Shingles) Vaccine (1 of 2) 04/13/2023*   Colon Cancer Screening  01/11/2024*   Hepatitis C Screening  07/30/2098*   Flu Shot  03/03/2023   Medicare Annual Wellness Visit  01/11/2024   DTaP/Tdap/Td vaccine (2 - Td or Tdap) 01/05/2027   Pneumonia Vaccine  Completed   DEXA scan (bone density measurement)  Completed   HPV Vaccine  Aged Out  *Topic was postponed. The date shown is not the original due date.    Advanced directives: Advance directive discussed with you today. Even though you declined this today, please call our office should you change your mind, and we can give you the proper paperwork for you to fill out.   Conditions/risks identified: None  Next appointment: Follow up in one year for your annual wellness visit    Preventive Care 65 Years and Older, Female Preventive care refers to lifestyle choices and visits with your health care provider that can promote health and wellness. What does preventive care include? A yearly physical exam. This is also called an annual well check. Dental exams once or twice a year. Routine eye exams. Ask your health care provider how often you should have your eyes checked. Personal lifestyle choices, including: Daily care of your teeth and gums. Regular physical activity. Eating a healthy diet. Avoiding tobacco and drug use. Limiting alcohol use. Practicing safe sex. Taking low-dose aspirin every day. Taking vitamin and mineral  supplements as recommended by your health care provider. What happens during an annual well check? The services and screenings done by your health care provider during your annual well check will depend on your age, overall health, lifestyle risk factors, and family history of disease. Counseling  Your health care provider may ask you questions about your: Alcohol use. Tobacco use. Drug use. Emotional well-being. Home and relationship well-being. Sexual activity. Eating habits. History of falls. Memory and ability to understand (cognition). Work and work Astronomer. Reproductive health. Screening  You may have the following tests or measurements: Height, weight, and BMI. Blood pressure. Lipid and cholesterol levels. These may be checked every 5 years, or more frequently if you are over 68 years old. Skin check. Lung cancer screening. You may have this screening every year starting at age 12 if you have a 30-pack-year history of smoking and currently smoke or have quit within the past 15 years. Fecal occult blood test (FOBT) of the stool. You may have this test every year starting at age 74. Flexible sigmoidoscopy or colonoscopy. You may have a sigmoidoscopy every 5 years or a colonoscopy every 10 years starting at age 40. Hepatitis C blood test. Hepatitis B blood test. Sexually transmitted disease (STD) testing. Diabetes screening. This is done by checking your blood sugar (glucose) after you have not eaten for a while (fasting). You may have this done every 1-3 years. Bone density scan. This is done to screen for osteoporosis.  You may have this done starting at age 43. Mammogram. This may be done every 1-2 years. Talk to your health care provider about how often you should have regular mammograms. Talk with your health care provider about your test results, treatment options, and if necessary, the need for more tests. Vaccines  Your health care provider may recommend certain  vaccines, such as: Influenza vaccine. This is recommended every year. Tetanus, diphtheria, and acellular pertussis (Tdap, Td) vaccine. You may need a Td booster every 10 years. Zoster vaccine. You may need this after age 71. Pneumococcal 13-valent conjugate (PCV13) vaccine. One dose is recommended after age 37. Pneumococcal polysaccharide (PPSV23) vaccine. One dose is recommended after age 85. Talk to your health care provider about which screenings and vaccines you need and how often you need them. This information is not intended to replace advice given to you by your health care provider. Make sure you discuss any questions you have with your health care provider. Document Released: 08/15/2015 Document Revised: 04/07/2016 Document Reviewed: 05/20/2015 Elsevier Interactive Patient Education  2017 [employer] Prevention in the Home Falls can cause injuries. They can happen to people of all ages. There are many things you can do to make your home safe and to help prevent falls. What can I do on the outside of my home? Regularly fix the edges of walkways and driveways and fix any cracks. Remove anything that might make you trip as you walk through a door, such as a raised step or threshold. Trim any bushes or trees on the path to your home. Use bright outdoor lighting. Clear any walking paths of anything that might make someone trip, such as rocks or tools. Regularly check to see if handrails are loose or broken. Make sure that both sides of any steps have handrails. Any raised decks and porches should have guardrails on the edges. Have any leaves, snow, or ice cleared regularly. Use sand or salt on walking paths during winter. Clean up any spills in your garage right away. This includes oil or grease spills. What can I do in the bathroom? Use night lights. Install grab bars by the toilet and in the tub and shower. Do not use towel bars as grab bars. Use non-skid mats or decals in  the tub or shower. If you need to sit down in the shower, use a plastic, non-slip stool. Keep the floor dry. Clean up any water that spills on the floor as soon as it happens. Remove soap buildup in the tub or shower regularly. Attach bath mats securely with double-sided non-slip rug tape. Do not have throw rugs and other things on the floor that can make you trip. What can I do in the bedroom? Use night lights. Make sure that you have a light by your bed that is easy to reach. Do not use any sheets or blankets that are too big for your bed. They should not hang down onto the floor. Have a firm chair that has side arms. You can use this for support while you get dressed. Do not have throw rugs and other things on the floor that can make you trip. What can I do in the kitchen? Clean up any spills right away. Avoid walking on wet floors. Keep items that you use a lot in easy-to-reach places. If you need to reach something above you, use a strong step stool that has a grab bar. Keep electrical cords out of the way. Do not use  floor polish or wax that makes floors slippery. If you must use wax, use non-skid floor wax. Do not have throw rugs and other things on the floor that can make you trip. What can I do with my stairs? Do not leave any items on the stairs. Make sure that there are handrails on both sides of the stairs and use them. Fix handrails that are broken or loose. Make sure that handrails are as long as the stairways. Check any carpeting to make sure that it is firmly attached to the stairs. Fix any carpet that is loose or worn. Avoid having throw rugs at the top or bottom of the stairs. If you do have throw rugs, attach them to the floor with carpet tape. Make sure that you have a light switch at the top of the stairs and the bottom of the stairs. If you do not have them, ask someone to add them for you. What else can I do to help prevent falls? Wear shoes that: Do not have high  heels. Have rubber bottoms. Are comfortable and fit you well. Are closed at the toe. Do not wear sandals. If you use a stepladder: Make sure that it is fully opened. Do not climb a closed stepladder. Make sure that both sides of the stepladder are locked into place. Ask someone to hold it for you, if possible. Clearly mark and make sure that you can see: Any grab bars or handrails. First and last steps. Where the edge of each step is. Use tools that help you move around (mobility aids) if they are needed. These include: Canes. Walkers. Scooters. Crutches. Turn on the lights when you go into a dark area. Replace any light bulbs as soon as they burn out. Set up your furniture so you have a clear path. Avoid moving your furniture around. If any of your floors are uneven, fix them. If there are any pets around you, be aware of where they are. Review your medicines with your doctor. Some medicines can make you feel dizzy. This can increase your chance of falling. Ask your doctor what other things that you can do to help prevent falls. This information is not intended to replace advice given to you by your health care provider. Make sure you discuss any questions you have with your health care provider. Document Released: 05/15/2009 Document Revised: 12/25/2015 Document Reviewed: 08/23/2014 Elsevier Interactive Patient Education  2017 [employer].

## 2023-01-11 NOTE — Progress Notes (Signed)
Subjective:   Sara Rice is a 77 y.o. female who presents for Medicare Annual (Subsequent) preventive examination.  Review of Systems    Virtual Visit via Telephone Note  I connected with  Sara Rice on 01/11/23 at 10:30 AM EDT by telephone and verified that I am speaking with the correct person using two identifiers.  Location: Patient: Home Provider: Office Persons participating in the virtual visit: patient/Nurse Health Advisor   I discussed the limitations, risks, security and privacy concerns of performing an evaluation and management service by telephone and the availability of in person appointments. The patient expressed understanding and agreed to proceed.  Interactive audio and video telecommunications were attempted between this nurse and patient, however failed, due to patient having technical difficulties OR patient did not have access to video capability.  We continued and completed visit with audio only.  Some vital signs may be absent or patient reported.   Tillie Rung, LPN  Cardiac Risk Factors include: advanced age (>30men, >64 women);hypertension     Objective:    Today's Vitals   01/11/23 1032  Weight: 171 lb (77.6 kg)  Height: 5\' 2"  (1.575 m)   Body mass index is 31.28 kg/m.     01/11/2023   10:43 AM 01/20/2022   12:34 PM 09/29/2015   10:06 AM 09/15/2015    9:57 AM 09/15/2015    9:56 AM  Advanced Directives  Does Patient Have a Medical Advance Directive? No No No No Yes  Type of Agricultural consultant;Living will  Does patient want to make changes to medical advance directive?    No - Patient declined No - Patient declined  Copy of Healthcare Power of Attorney in Chart?    No - copy requested No - copy requested  Would patient like information on creating a medical advance directive? No - Patient declined No - Patient declined  No - patient declined information     Current Medications  (verified) Outpatient Encounter Medications as of 01/11/2023  Medication Sig   amLODipine (NORVASC) 10 MG tablet TAKE 1 TABLET(10 MG) BY MOUTH DAILY   aspirin 81 MG tablet Take 81 mg by mouth daily.     cholecalciferol (VITAMIN D) 1000 UNITS tablet Take 1,000 Units by mouth daily.     Ferrous Sulfate (IRON) 325 (65 FE) MG TABS Take 1 tablet by mouth daily.   lisinopril (ZESTRIL) 10 MG tablet TAKE 1 TABLET(10 MG) BY MOUTH DAILY   metoprolol succinate (TOPROL-XL) 100 MG 24 hr tablet Take 1 tablet (100 mg total) by mouth daily. Annual appt is due must see provider for future refills   No facility-administered encounter medications on file as of 01/11/2023.    Allergies (verified) Patient has no known allergies.   History: Past Medical History:  Diagnosis Date   Anemia, unspecified    Benign neoplasm of colon    Calculus of kidney    HEMORRHOIDS 05/26/2009   Osteoarthrosis, unspecified whether generalized or localized, unspecified site    Other and unspecified hyperlipidemia    Other symptoms involving cardiovascular system    Unspecified essential hypertension    Unspecified hemorrhoids without mention of complication    Unspecified vitamin D deficiency    Past Surgical History:  Procedure Laterality Date   COLONOSCOPY     DENTAL SURGERY     Family History  Problem Relation Age of Onset   Diabetes Mother    Hypertension Mother  Colon polyps Mother    Stroke Brother        late 73s, nonsmoker   Hypertension Brother    Hypertension Sister        hx of obesity   Colon cancer Neg Hx    Social History   Socioeconomic History   Marital status: Legally Separated    Spouse name: Not on file   Number of children: 2   Years of education: Not on file   Highest education level: Not on file  Occupational History   Occupation: [place]    Employer: [employer]  Tobacco Use   Smoking status: Never   Smokeless tobacco: Never  Substance and Sexual  Activity   Alcohol use: No   Drug use: No   Sexual activity: Not on file  Other Topics Concern   Not on file  Social History Narrative   Widowed in 2012 (rectal cancer-they were not together at that time). 2 kids. 1 grandson.       Retired from [employer] country [employer] then returned to work 4 hours per day      Hobbies: shopping, reading, relaxing, gardening   Social Determinants of Health   Financial Resource Strain: Low Risk  (01/11/2023)   Overall Financial Resource Strain (CARDIA)    Difficulty of Paying Living Expenses: Not hard at all  Food Insecurity: No Food Insecurity (01/11/2023)   Hunger Vital Sign    Worried About Running Out of Food in the Last Year: Never true    Ran Out of Food in the Last Year: Never true  Transportation Needs: No Transportation Needs (01/11/2023)   PRAPARE - Administrator, Civil Service (Medical): No    Lack of Transportation (Non-Medical): No  Physical Activity: Inactive (01/11/2023)   Exercise Vital Sign    Days of Exercise per Week: 0 days    Minutes of Exercise per Session: 0 min  Stress: No Stress Concern Present (01/11/2023)   [employer] of Occupational Health - Occupational Stress Questionnaire    Feeling of Stress : Not at all  Social Connections: Moderately Integrated (01/11/2023)   Social Connection and Isolation Panel [NHANES]    Frequency of Communication with Friends and Family: More than three times a week    Frequency of Social Gatherings with Friends and Family: More than three times a week    Attends Religious Services: More than 4 times per year    Active Member of [employer] or Organizations: Yes    Attends Banker Meetings: More than 4 times per year    Marital Status: Widowed    Tobacco Counseling Counseling given: Not Answered   Clinical Intake:  Pre-visit preparation completed: No  Pain : No/denies pain     BMI - recorded: 31.28 Nutritional Status: BMI > 30  Obese Nutritional Risks:  None Diabetes: No  How often do you need to have someone help you when you read instructions, pamphlets, or other written materials from your doctor or pharmacy?: 1 - Never  Diabetic?  No  Interpreter Needed?: No  Information entered by :: Theresa Mulligan LPN   Activities of Daily Living    01/11/2023   10:39 AM 01/20/2022   12:38 PM  In your present state of health, do you have any difficulty performing the following activities:  Hearing? 1 0  Comment Wears hearing aids   Vision? 0 0  Difficulty concentrating or making decisions? 0 0  Walking or climbing stairs? 0 0  Dressing or bathing? 0 0  Doing errands, shopping? 0 0  Preparing Food and eating ? N N  Using the Toilet? N N  In the past six months, have you accidently leaked urine? N N  Do you have problems with loss of bowel control? N N  Managing your Medications? N N  Managing your Finances? N N  Housekeeping or managing your Housekeeping? N N    Patient Care Team: Myrlene Broker, MD as PCP - General (Internal Medicine)  Indicate any recent Medical Services you may have received from other than [facility] providers in the past year (date may be approximate).     Assessment:   This is a routine wellness examination for Sara Rice.  Hearing/Vision screen Hearing Screening - Comments:: Wears hearing aids Vision Screening - Comments:: Wears rx glasses - up to date with routine eye exams with  [facility]  Dietary issues and exercise activities discussed: Exercise limited by: None identified   Goals Addressed               This Visit's Progress     To stay healthy and motivated. (pt-stated)         Depression Screen    01/11/2023   10:38 AM 01/25/2022   10:41 AM 01/20/2022   12:38 PM 01/19/2021    1:03 PM 01/18/2020    9:57 AM 01/16/2019    1:47 PM 07/14/2018    9:27 AM  PHQ 2/9 Scores  PHQ - 2 Score 0 2 0 0 0 1 0  PHQ- 9 Score  4         Fall Risk    01/11/2023   10:40 AM 01/25/2022   10:41 AM  01/20/2022   12:35 PM 01/19/2021    9:57 AM 01/18/2020    9:57 AM  Fall Risk   Falls in the past year? 0 0 0 1 0  Number falls in past yr: 0 0 0 0   Injury with Fall? 0 0 0 1   Risk for fall due to : No Fall Risks  No Fall Risks No Fall Risks   Follow up Falls prevention discussed  Falls evaluation completed Falls evaluation completed     FALL RISK PREVENTION PERTAINING TO THE HOME:  Any stairs in or around the home? Yes  If so, are there any without handrails? No  Home free of loose throw rugs in walkways, pet beds, electrical cords, etc? Yes  Adequate lighting in your home to reduce risk of falls? Yes   ASSISTIVE DEVICES UTILIZED TO PREVENT FALLS:  Life alert? No  Use of a cane, walker or w/c? No  Grab bars in the bathroom? No  Shower chair or bench in shower? No  Elevated toilet seat or a handicapped toilet? Yes   TIMED UP AND GO:  Was the test performed? No . Audio Visit   Cognitive Function:        01/11/2023   10:43 AM 01/20/2022   12:42 PM  6CIT Screen  What Year? 0 points 0 points  What month? 0 points 0 points  What time? 0 points 0 points  Count back from 20 0 points 0 points  Months in reverse 0 points 0 points  Repeat phrase 0 points 0 points  Total Score 0 points 0 points    Immunizations Immunization History  Administered Date(s) Administered   Influenza Split 05/31/2011, 05/29/2012   Influenza Whole 06/18/2009, 05/02/2010   Influenza, High Dose Seasonal PF 05/26/2016, 07/06/2017,  07/14/2018   Influenza,inj,Quad PF,6+ Mos 06/18/2013, 05/14/2014, 04/29/2015   Influenza-Unspecified 06/18/2013, 05/14/2014, 04/29/2015   PFIZER Comirnaty(Gray Top)Covid-19 Tri-Sucrose Vaccine 12/06/2020   PFIZER(Purple Top)SARS-COV-2 Vaccination 10/14/2019, 11/05/2019, 06/07/2020   Pfizer Covid-19 Vaccine Bivalent Booster 84yrs & up 04/08/2021   Pneumococcal Conjugate-13 06/25/2014   Pneumococcal Polysaccharide-23 11/25/2015   Tdap 01/04/2017    TDAP status: Up to  date    Pneumococcal vaccine status: Up to date  Covid-19 vaccine status: Completed vaccines  Qualifies for Shingles Vaccine? Yes   Zostavax completed No   Shingrix Completed?: No.    Education has been provided regarding the importance of this vaccine. Patient has been advised to call insurance company to determine out of pocket expense if they have not yet received this vaccine. Advised may also receive vaccine at local pharmacy or Health Dept. Verbalized acceptance and understanding.  Screening Tests Health Maintenance  Topic Date Due   COVID-19 Vaccine (6 - 2023-24 season) 01/27/2023 (Originally 04/02/2022)   Zoster Vaccines- Shingrix (1 of 2) 04/13/2023 (Originally 10/31/1997)   Colonoscopy  01/11/2024 (Originally 09/28/2020)   Hepatitis C Screening  07/30/2098 (Originally [birth year]1965)   INFLUENZA VACCINE  03/03/2023   Medicare Annual Wellness (AWV)  01/11/2024   DTaP/Tdap/Td (2 - Td or Tdap) 01/05/2027   Pneumonia Vaccine 42+ Years old  Completed   DEXA SCAN  Completed   HPV VACCINES  Aged Out    Health Maintenance  There are no preventive care reminders to display for this patient.   Colorectal cancer screening: Referral to GI placed Patient declined. Pt aware the office will call re: appt.  Mammogram status: No longer required due to Age.  Bone Density status: Completed 01/22/15. Results reflect: Bone density results: OSTEOPOROSIS. Repeat every   years.  Lung Cancer Screening: (Low Dose CT Chest recommended if Age 29-80 years, 30 pack-year currently smoking OR have quit w/in 15years.) does not qualify.     Additional Screening:  Hepatitis C Screening: does qualify;  Deferred  Vision Screening: Recommended annual ophthalmology exams for early detection of glaucoma and other disorders of the eye. Is the patient up to date with their annual eye exam?  Yes  Who is the provider or what is the name of the office in which the patient attends annual eye exams? [facility] If pt  is not established with a provider, would they like to be referred to a provider to establish care? No .   Dental Screening: Recommended annual dental exams for proper oral hygiene  Community Resource Referral / Chronic Care Management:  CRR required this visit?  No   CCM required this visit?  No      Plan:     I have personally reviewed and noted the following in the patient's chart:   Medical and social history Use of alcohol, tobacco or illicit drugs  Current medications and supplements including opioid prescriptions. Patient is not currently taking opioid prescriptions. Functional ability and status Nutritional status Physical activity Advanced directives List of other physicians Hospitalizations, surgeries, and ER visits in previous 12 months Vitals Screenings to include cognitive, depression, and falls Referrals and appointments  In addition, I have reviewed and discussed with patient certain preventive protocols, quality metrics, and best practice recommendations. A written personalized care plan for preventive services as well as general preventive health recommendations were provided to patient.     Tillie Rung, LPN   [id number]   Nurse Notes: None

## 2023-01-27 ENCOUNTER — Encounter: Payer: Medicare Other | Admitting: Internal Medicine

## 2023-01-30 ENCOUNTER — Other Ambulatory Visit: Payer: Self-pay | Admitting: Internal Medicine

## 2023-01-31 ENCOUNTER — Other Ambulatory Visit: Payer: Self-pay | Admitting: Internal Medicine

## 2023-03-01 ENCOUNTER — Other Ambulatory Visit: Payer: Self-pay | Admitting: Internal Medicine

## 2023-03-04 ENCOUNTER — Ambulatory Visit (INDEPENDENT_AMBULATORY_CARE_PROVIDER_SITE_OTHER): Payer: Medicare Other | Admitting: Internal Medicine

## 2023-03-04 ENCOUNTER — Encounter: Payer: Self-pay | Admitting: Internal Medicine

## 2023-03-04 VITALS — BP 138/68 | HR 83 | Temp 98.3°F | Ht 62.0 in | Wt 171.0 lb

## 2023-03-04 DIAGNOSIS — I10 Essential (primary) hypertension: Secondary | ICD-10-CM | POA: Diagnosis not present

## 2023-03-04 DIAGNOSIS — Z1159 Encounter for screening for other viral diseases: Secondary | ICD-10-CM | POA: Diagnosis not present

## 2023-03-04 DIAGNOSIS — N1831 Chronic kidney disease, stage 3a: Secondary | ICD-10-CM | POA: Diagnosis not present

## 2023-03-04 DIAGNOSIS — Z0000 Encounter for general adult medical examination without abnormal findings: Secondary | ICD-10-CM

## 2023-03-04 DIAGNOSIS — Z86010 Personal history of colonic polyps: Secondary | ICD-10-CM

## 2023-03-04 DIAGNOSIS — E782 Mixed hyperlipidemia: Secondary | ICD-10-CM | POA: Diagnosis not present

## 2023-03-04 LAB — CBC
HCT: 35.8 % — ABNORMAL LOW (ref 36.0–46.0)
Hemoglobin: 11.8 g/dL — ABNORMAL LOW (ref 12.0–15.0)
MCHC: 33 g/dL (ref 30.0–36.0)
MCV: 88.9 fl (ref 78.0–100.0)
Platelets: 429 10*3/uL — ABNORMAL HIGH (ref 150.0–400.0)
RBC: 4.02 Mil/uL (ref 3.87–5.11)
RDW: 14 % (ref 11.5–15.5)
WBC: 7.6 10*3/uL (ref 4.0–10.5)

## 2023-03-04 LAB — COMPREHENSIVE METABOLIC PANEL
ALT: 18 U/L (ref 0–35)
AST: 19 U/L (ref 0–37)
Albumin: 4.2 g/dL (ref 3.5–5.2)
Alkaline Phosphatase: 96 U/L (ref 39–117)
BUN: 31 mg/dL — ABNORMAL HIGH (ref 6–23)
CO2: 26 mEq/L (ref 19–32)
Calcium: 10.1 mg/dL (ref 8.4–10.5)
Chloride: 104 mEq/L (ref 96–112)
Creatinine, Ser: 1.68 mg/dL — ABNORMAL HIGH (ref 0.40–1.20)
GFR: 29.61 mL/min — ABNORMAL LOW (ref >60.00)
Glucose, Bld: 127 mg/dL — ABNORMAL HIGH (ref 70–99)
Potassium: 4.5 mEq/L (ref 3.5–5.1)
Sodium: 138 mEq/L (ref 135–145)
Total Bilirubin: 0.2 mg/dL (ref 0.2–1.2)
Total Protein: 9 g/dL — ABNORMAL HIGH (ref 6.0–8.3)

## 2023-03-04 LAB — LIPID PANEL
Cholesterol: 205 mg/dL — ABNORMAL HIGH (ref 0–200)
HDL: 30.6 mg/dL — ABNORMAL LOW (ref >39.00)
NonHDL: 174.22
Total CHOL/HDL Ratio: 7
Triglycerides: 279 mg/dL — ABNORMAL HIGH (ref 0.0–149.0)
VLDL: 55.8 mg/dL — ABNORMAL HIGH (ref 0.0–40.0)

## 2023-03-04 LAB — LDL CHOLESTEROL, DIRECT: Direct LDL: 125 mg/dL

## 2023-03-04 LAB — HEMOGLOBIN A1C: Hgb A1c MFr Bld: 5.8 % (ref 4.6–6.5)

## 2023-03-04 MED ORDER — LISINOPRIL 10 MG PO TABS: 10.0000 mg | ORAL_TABLET | Freq: Every day | ORAL | 3 refills | Status: DC

## 2023-03-04 MED ORDER — METOPROLOL SUCCINATE ER 100 MG PO TB24: 100.0000 mg | ORAL_TABLET | Freq: Every day | ORAL | 3 refills | Status: DC

## 2023-03-04 MED ORDER — AMLODIPINE BESYLATE 10 MG PO TABS: 10.0000 mg | ORAL_TABLET | Freq: Every day | ORAL | 3 refills | Status: DC

## 2023-03-04 NOTE — Progress Notes (Signed)
   Subjective:   Patient ID: Sara Rice, female    DOB: May 06, 1946, 77 y.o.   MRN: [id number]  HPI The patient is here for physical.  PMH, [facility], social history reviewed and updated  Review of Systems  Constitutional: Negative.   HENT: Negative.    Eyes: Negative.   Respiratory:  Negative for cough, chest tightness and shortness of breath.   Cardiovascular:  Negative for chest pain, palpitations and leg swelling.  Gastrointestinal:  Negative for abdominal distention, abdominal pain, constipation, diarrhea, nausea and vomiting.  Musculoskeletal: Negative.   Skin: Negative.   Neurological: Negative.   Psychiatric/Behavioral: Negative.      Objective:  Physical Exam Constitutional:      Appearance: She is well-developed.  HENT:     Head: Normocephalic and atraumatic.  Cardiovascular:     Rate and Rhythm: Normal rate and regular rhythm.  Pulmonary:     Effort: Pulmonary effort is normal. No respiratory distress.     Breath sounds: Normal breath sounds. No wheezing or rales.  Abdominal:     General: Bowel sounds are normal. There is no distension.     Palpations: Abdomen is soft.     Tenderness: There is no abdominal tenderness. There is no rebound.  Musculoskeletal:     Cervical back: Normal range of motion.  Skin:    General: Skin is warm and dry.  Neurological:     Mental Status: She is alert and oriented to person, place, and time.     Coordination: Coordination normal.     Vitals:   03/04/23 1337 03/04/23 1339 03/04/23 1355  BP: (!) 160/70 (!) 160/70 138/68  Pulse: 83    Temp: 98.3 F (36.8 C)    TempSrc: Oral    SpO2: 96%    Weight: 171 lb (77.6 kg)    Height: 5\' 2"  (1.575 m)      Assessment & Plan:

## 2023-03-04 NOTE — Assessment & Plan Note (Signed)
Checking CMP and adjust lisinopril 10 mg daily and amlodipine 10 mg daily and metoprolol 100 mg daily. BP at goal.

## 2023-03-04 NOTE — Assessment & Plan Note (Signed)
Checking CMP today and referral to nephrology she wishes to consult and now that mom has passed she has time to care for her health.

## 2023-03-04 NOTE — Assessment & Plan Note (Signed)
Wants to wait on colonoscopy until she sees nephrology asked her to call or mychart when she is ready. Cannot do cologuard due to prior polyps.

## 2023-03-04 NOTE — Assessment & Plan Note (Signed)
Checking lipid panel and adjust as needed. Not on statin. 

## 2023-03-04 NOTE — Patient Instructions (Signed)
We will check the labs and get you in with the kidney doctor.

## 2023-03-04 NOTE — Assessment & Plan Note (Signed)
Flu shot yearly. Pneumonia complete. Shingrix due at pharmacy. Tetanus due at pharmacy. Colonoscopy due. Mammogram up to date, pap smear aged out and dexa complete. Counseled about sun safety and mole surveillance. Counseled about the dangers of distracted driving. Given 10 year screening recommendations.

## 2023-05-23 DIAGNOSIS — I129 Hypertensive chronic kidney disease with stage 1 through stage 4 chronic kidney disease, or unspecified chronic kidney disease: Secondary | ICD-10-CM | POA: Diagnosis not present

## 2023-05-23 DIAGNOSIS — D631 Anemia in chronic kidney disease: Secondary | ICD-10-CM | POA: Diagnosis not present

## 2023-05-23 DIAGNOSIS — N184 Chronic kidney disease, stage 4 (severe): Secondary | ICD-10-CM | POA: Diagnosis not present

## 2023-05-23 DIAGNOSIS — N1831 Chronic kidney disease, stage 3a: Secondary | ICD-10-CM | POA: Diagnosis not present

## 2023-05-24 LAB — LAB REPORT - SCANNED
Albumin, Urine POC: 183.2
Creatinine, POC: 111.3 mg/dL
EGFR: 37
Microalb Creat Ratio: 165

## 2023-10-03 DIAGNOSIS — D631 Anemia in chronic kidney disease: Secondary | ICD-10-CM | POA: Diagnosis not present

## 2023-10-03 DIAGNOSIS — N189 Chronic kidney disease, unspecified: Secondary | ICD-10-CM | POA: Diagnosis not present

## 2023-10-03 DIAGNOSIS — I129 Hypertensive chronic kidney disease with stage 1 through stage 4 chronic kidney disease, or unspecified chronic kidney disease: Secondary | ICD-10-CM | POA: Diagnosis not present

## 2023-10-03 DIAGNOSIS — N184 Chronic kidney disease, stage 4 (severe): Secondary | ICD-10-CM | POA: Diagnosis not present

## 2023-10-08 LAB — LAB REPORT - SCANNED
Albumin, Urine POC: 483.9
Creatinine, POC: 106.4 mg/dL
Microalb Creat Ratio: 455

## 2023-10-27 DIAGNOSIS — N184 Chronic kidney disease, stage 4 (severe): Secondary | ICD-10-CM | POA: Diagnosis not present

## 2023-11-14 ENCOUNTER — Telehealth: Payer: Self-pay | Admitting: Internal Medicine

## 2023-11-14 NOTE — Telephone Encounter (Signed)
 Copied from CRM [id number]. Topic: Clinical - Prescription Issue >> Nov 14, 2023 11:06 AM Adaysia C wrote: Reason for CRM: Patient needs the prescription for lisinopril (ZESTRIL) 10 MG tablet quantity to be updated to two 10 MG tablets per day; please update the prescription dispense quantity to cover 2 tablets per day; after update please refill patients prescription to the preferred pharmacy; patient has 5 days worth of pills left; please follow up with patient if necessary #[phone] [id number] Jonette Nestle, [place] - [address] AT [facility] [address], [place] [address] Phone: [phone]  Fax: [phone]  ---  LVM to pt to sch

## 2023-11-14 NOTE — Telephone Encounter (Signed)
 Pt is asking for a refill change. Advise pt to schedule a appt?

## 2023-11-14 NOTE — Telephone Encounter (Signed)
 If another provider (likely nephrology) has changed dose she should have them refill. Would need visit with meor notes to support change in dose.

## 2024-01-16 ENCOUNTER — Ambulatory Visit (INDEPENDENT_AMBULATORY_CARE_PROVIDER_SITE_OTHER): Payer: Medicare Other

## 2024-01-16 VITALS — Ht 62.0 in | Wt 171.0 lb

## 2024-01-16 DIAGNOSIS — Z0000 Encounter for general adult medical examination without abnormal findings: Secondary | ICD-10-CM | POA: Diagnosis not present

## 2024-01-16 NOTE — Patient Instructions (Signed)
 Sara Rice , Thank you for taking time out of your busy schedule to complete your Annual Wellness Visit with me. I enjoyed our conversation and look forward to speaking with you again next year. I, as well as your care team,  appreciate your ongoing commitment to your health goals. Please review the following plan we discussed and let me know if I can assist you in the future. Your Game plan/ To Do List    Follow up Visits: Next Medicare AWV with our clinical staff: 01/16/2025   Have you seen your provider in the last 6 months (3 months if uncontrolled diabetes)? No Next Office Visit with your provider: 03/05/2024  Clinician Recommendations:  Aim for 30 minutes of exercise or brisk walking, 6-8 glasses of water, and 5 servings of fruits and vegetables each day. Will consult w/PCP of the need to have a repeat Colonoscopy vs Cologuard due to age.      This is a list of the screening recommended for you and due dates:  Health Maintenance  Topic Date Due   Zoster (Shingles) Vaccine (1 of 2) Never done   Colon Cancer Screening  09/28/2020   COVID-19 Vaccine (6 - 2024-25 season) 04/03/2023   Flu Shot  03/02/2024   Medicare Annual Wellness Visit  01/15/2025   DTaP/Tdap/Td vaccine (2 - Td or Tdap) 01/05/2027   Pneumococcal Vaccine for age over 49  Completed   DEXA scan (bone density measurement)  Completed   Hepatitis C Screening  Completed   HPV Vaccine  Aged Out   Meningitis B Vaccine  Aged Out    Advanced directives: (Declined) Advance directive discussed with you today. Even though you declined this today, please call our office should you change your mind, and we can give you the proper paperwork for you to fill out. Advance Care Planning is important because it:  [x]  Makes sure you receive the medical care that is consistent with your values, goals, and preferences  [x]  It provides guidance to your family and loved ones and reduces their decisional burden about whether or not they are  making the right decisions based on your wishes.  Follow the link provided in your after visit summary or read over the paperwork we have mailed to you to help you started getting your Advance Directives in place. If you need assistance in completing these, please reach out to us  so that we can help you!

## 2024-01-16 NOTE — Progress Notes (Signed)
 Subjective:  Please attest and cosign this visit due to patients primary care provider not being in the office at the time the visit was completed.  ([name])   Sara Rice is a 78 y.o. who presents for a Medicare Wellness preventive visit.  As a reminder, Annual Wellness Visits don't include a physical exam, and some assessments may be limited, especially if this visit is performed virtually. We may recommend an in-person follow-up visit with your provider if needed.  Visit Complete: Virtual I connected with  [name] on 01/16/24 by a audio enabled telemedicine application and verified that I am speaking with the correct person using two identifiers.  Patient Location: Home  Provider Location: Office/Clinic  I discussed the limitations of evaluation and management by telemedicine. The patient expressed understanding and agreed to proceed.  Vital Signs: Because this visit was a virtual/telehealth visit, some criteria may be missing or patient reported. Any vitals not documented were not able to be obtained and vitals that have been documented are patient reported.  VideoDeclined- This patient declined Librarian, academic. Therefore the visit was completed with audio only.  Persons Participating in Visit: Patient.  AWV Questionnaire: No: Patient Medicare AWV questionnaire was not completed prior to this visit.  Cardiac Risk Factors include: advanced age (>67men, >67 women);dyslipidemia;hypertension;obesity (BMI >30kg/m2)     Objective:    Today's Vitals   01/16/24 1554  Weight: 171 lb (77.6 kg)  Height: 5' 2 (1.575 m)   Body mass index is 31.28 kg/m.     01/16/2024    3:51 PM 01/11/2023   10:43 AM 01/20/2022   12:34 PM 09/29/2015   10:06 AM 09/15/2015    9:57 AM 09/15/2015    9:56 AM  Advanced Directives  Does Patient Have a Medical Advance Directive? No No No No  No  Yes   Type of Tax inspector;Living will   Does patient want to make changes to medical advance directive?     No - Patient declined  No - Patient declined   Copy of Healthcare Power of Attorney in Chart?     No - copy requested  No - copy requested   Would patient like information on creating a medical advance directive? No - Patient declined No - Patient declined No - Patient declined  No - patient declined information       Data saved with a previous flowsheet row definition    Current Medications (verified) Outpatient Encounter Medications as of 01/16/2024  Medication Sig   amLODipine  (NORVASC ) 10 MG tablet Take 1 tablet (10 mg total) by mouth daily.   aspirin 81 MG tablet Take 81 mg by mouth daily.     cholecalciferol (VITAMIN D) 1000 UNITS tablet Take 1,000 Units by mouth daily.     Ferrous Sulfate (IRON) 325 (65 FE) MG TABS Take 1 tablet by mouth daily.   lisinopril  (ZESTRIL ) 10 MG tablet Take 1 tablet (10 mg total) by mouth daily.   lisinopril  (ZESTRIL ) 20 MG tablet Take 20 mg by mouth daily.   metoprolol  succinate (TOPROL -XL) 100 MG 24 hr tablet Take 1 tablet (100 mg total) by mouth daily. Take with or immediately following a meal.   No facility-administered encounter medications on file as of 01/16/2024.    Allergies (verified) Patient has no known allergies.   History: Past Medical History:  Diagnosis Date   Anemia, unspecified  Benign neoplasm of colon    Calculus of kidney    HEMORRHOIDS 05/26/2009   Osteoarthrosis, unspecified whether generalized or localized, unspecified site    Other and unspecified hyperlipidemia    Other symptoms involving cardiovascular system    Unspecified essential hypertension    Unspecified hemorrhoids without mention of complication    Unspecified vitamin D deficiency    Past Surgical History:  Procedure Laterality Date   COLONOSCOPY     DENTAL SURGERY     Family History  Problem Relation Age of Onset   Diabetes Mother     Hypertension Mother    Colon polyps Mother    Stroke Brother        late 43s, nonsmoker   Hypertension Brother    Hypertension Sister        hx of obesity   Colon cancer Neg Hx    Social History   Socioeconomic History   Marital status: Widowed    Spouse name: Not on file   Number of children: 2   Years of education: Not on file   Highest education level: Not on file  Occupational History   Occupation: [employer]    Employer: [employer]  Tobacco Use   Smoking status: Never   Smokeless tobacco: Never  Substance and Sexual Activity   Alcohol use: No   Drug use: No   Sexual activity: Not Currently  Other Topics Concern   Not on file  Social History Narrative   Widowed in 2012 (rectal cancer-they were not together at that time). 2 kids. 1 grandson.       Retired from [employer] country [employer] then returned to work 4 hours per day      Hobbies: shopping, reading, relaxing, gardening   Social Drivers of Corporate investment banker Strain: Low Risk  (01/16/2024)   Overall Financial Resource Strain (CARDIA)    Difficulty of Paying Living Expenses: Not hard at all  Food Insecurity: No Food Insecurity (01/16/2024)   Hunger Vital Sign    Worried About Running Out of Food in the Last Year: Never true    Ran Out of Food in the Last Year: Never true  Transportation Needs: No Transportation Needs (01/16/2024)   PRAPARE - Administrator, Civil Service (Medical): No    Lack of Transportation (Non-Medical): No  Physical Activity: Insufficiently Active (01/16/2024)   Exercise Vital Sign    Days of Exercise per Week: 5 days    Minutes of Exercise per Session: 20 min  Stress: No Stress Concern Present (01/16/2024)   [employer] of Occupational Health - Occupational Stress Questionnaire    Feeling of Stress: Not at all  Social Connections: Moderately Integrated (01/16/2024)   Social Connection and Isolation Panel    Frequency of Communication with  Friends and Family: More than three times a week    Frequency of Social Gatherings with Friends and Family: More than three times a week    Attends Religious Services: More than 4 times per year    Active Member of [employer] or Organizations: Yes    Attends Banker Meetings: More than 4 times per year    Marital Status: Widowed    Tobacco Counseling Counseling given: No    Clinical Intake:  Pre-visit preparation completed: Yes  Pain : No/denies pain     BMI - recorded: 31.28 Nutritional Status: BMI > 30  Obese Nutritional Risks: None Diabetes: No  Lab Results  Component Value  Date   HGBA1C 5.8 03/04/2023     How often do you need to have someone help you when you read instructions, pamphlets, or other written materials from your doctor or pharmacy?: 1 - Never  Interpreter Needed?: No  Information entered by :: Kandy Orris, CMA   Activities of Daily Living     01/16/2024    3:57 PM  In your present state of health, do you have any difficulty performing the following activities:  Hearing? 0  Vision? 0  Difficulty concentrating or making decisions? 0  Walking or climbing stairs? 0  Dressing or bathing? 0  Doing errands, shopping? 0  Preparing Food and eating ? N  Using the Toilet? N  In the past six months, have you accidently leaked urine? N  Do you have problems with loss of bowel control? N  Managing your Medications? N  Managing your Finances? N  Housekeeping or managing your Housekeeping? N    Patient Care Team: Adelia Homestead, MD as PCP - General (Internal Medicine) [facility] (Ophthalmology)  I have updated your Care Teams any recent Medical Services you may have received from other providers in the past year.     Assessment:   This is a routine wellness examination for Sara Rice.  Hearing/Vision screen Hearing Screening - Comments:: Wears hearing aids Vision Screening - Comments:: Wears rx glasses - plans to  schedule an appt w/[facility]   Goals Addressed               This Visit's Progress     Patient Stated (pt-stated)        Patient stated she plans to continue exercising and watching diet - staying active       Depression Screen     01/16/2024    3:59 PM 01/11/2023   10:38 AM 01/25/2022   10:41 AM 01/20/2022   12:38 PM 01/19/2021    1:03 PM 01/18/2020    9:57 AM 01/16/2019    1:47 PM  PHQ 2/9 Scores  PHQ - 2 Score 0 0 2 0 0 0 1  PHQ- 9 Score 0  4        Fall Risk     01/16/2024    3:58 PM 03/04/2023    1:39 PM 01/11/2023   10:40 AM 01/25/2022   10:41 AM 01/20/2022   12:35 PM  Fall Risk   Falls in the past year? 0 0 0 0 0  Number falls in past yr: 0 0 0 0 0  Injury with Fall? 0 0 0 0 0  Risk for fall due to : No Fall Risks;History of fall(s);Impaired balance/gait  No Fall Risks  No Fall Risks  Risk for fall due to: Comment 28yrs ago dislocated elbow      Follow up Falls evaluation completed;Falls prevention discussed Falls evaluation completed Falls prevention discussed  Falls evaluation completed      Data saved with a previous flowsheet row definition    MEDICARE RISK AT HOME:  Medicare Risk at Home Any stairs in or around the home?: Yes (outside) If so, are there any without handrails?: No Home free of loose throw rugs in walkways, pet beds, electrical cords, etc?: Yes Adequate lighting in your home to reduce risk of falls?: Yes Life alert?: No Use of a cane, walker or w/c?: No Grab bars in the bathroom?: No Shower chair or bench in shower?: No Elevated toilet seat or a handicapped toilet?: Yes  TIMED UP AND GO:  Was the test performed?  No  Cognitive Function: 6CIT completed        01/16/2024    4:02 PM 01/11/2023   10:43 AM 01/20/2022   12:42 PM  6CIT Screen  What Year? 0 points 0 points 0 points  What month? 0 points 0 points 0 points  What time? 0 points 0 points 0 points  Count back from 20 0 points 0 points 0 points  Months in reverse 0 points 0  points 0 points  Repeat phrase 0 points 0 points 0 points  Total Score 0 points 0 points 0 points    Immunizations Immunization History  Administered Date(s) Administered   Influenza Split 05/31/2011, 05/29/2012   Influenza Whole 06/18/2009, 05/02/2010   Influenza, High Dose Seasonal PF 05/26/2016, 07/06/2017, 07/14/2018   Influenza,inj,Quad PF,6+ Mos 06/18/2013, 05/14/2014, 04/29/2015   Influenza-Unspecified 06/18/2013, 05/14/2014, 04/29/2015   PFIZER Comirnaty(Gray Top)Covid-19 Tri-Sucrose Vaccine 12/06/2020   PFIZER(Purple Top)SARS-COV-2 Vaccination 10/14/2019, 11/05/2019, 06/07/2020   Pfizer Covid-19 Vaccine Bivalent Booster 40yrs & up 04/08/2021   Pneumococcal Conjugate-13 06/25/2014   Pneumococcal Polysaccharide-23 11/25/2015   Tdap 01/04/2017    Screening Tests Health Maintenance  Topic Date Due   Zoster Vaccines- Shingrix (1 of 2) Never done   Colonoscopy  09/28/2020   COVID-19 Vaccine (6 - 2024-25 season) 04/03/2023   INFLUENZA VACCINE  03/02/2024   Medicare Annual Wellness (AWV)  01/15/2025   DTaP/Tdap/Td (2 - Td or Tdap) 01/05/2027   Pneumococcal Vaccine: 50+ Years  Completed   DEXA SCAN  Completed   Hepatitis C Screening  Completed   HPV VACCINES  Aged Out   Meningococcal B Vaccine  Aged Out    Health Maintenance  Health Maintenance Due  Topic Date Due   Zoster Vaccines- Shingrix (1 of 2) Never done   Colonoscopy  09/28/2020   COVID-19 Vaccine (6 - 2024-25 season) 04/03/2023   Health Maintenance Items Addressed:  Colonoscopy status: Will consult w/PCP due to age 66.  Additional Screening:  Vision Screening: Recommended annual ophthalmology exams for early detection of glaucoma and other disorders of the eye. Would you like a referral to an eye doctor? No  Pt plans to schedule an appt w/[facility] in [place], [place] in 03/2024.  Dental Screening: Recommended annual dental exams for proper oral hygiene  Community Resource Referral / Chronic Care  Management: CRR required this visit?  No   CCM required this visit?  No   Plan:    I have personally reviewed and noted the following in the patient's chart:   Medical and social history Use of alcohol, tobacco or illicit drugs  Current medications and supplements including opioid prescriptions. Patient is not currently taking opioid prescriptions. Functional ability and status Nutritional status Physical activity Advanced directives List of other physicians Hospitalizations, surgeries, and ER visits in previous 12 months Vitals Screenings to include cognitive, depression, and falls Referrals and appointments  In addition, I have reviewed and discussed with patient certain preventive protocols, quality metrics, and best practice recommendations. A written personalized care plan for preventive services as well as general preventive health recommendations were provided to patient.   Patria Bookbinder, CMA   01/16/2024   After Visit Summary: (MyChart) Due to this being a telephonic visit, the after visit summary with patients personalized plan was offered to patient via MyChart   Notes: Nothing significant to report at this time.

## 2024-01-31 DIAGNOSIS — N184 Chronic kidney disease, stage 4 (severe): Secondary | ICD-10-CM | POA: Diagnosis not present

## 2024-02-07 DIAGNOSIS — D631 Anemia in chronic kidney disease: Secondary | ICD-10-CM | POA: Diagnosis not present

## 2024-02-07 DIAGNOSIS — N1832 Chronic kidney disease, stage 3b: Secondary | ICD-10-CM | POA: Diagnosis not present

## 2024-02-07 DIAGNOSIS — I129 Hypertensive chronic kidney disease with stage 1 through stage 4 chronic kidney disease, or unspecified chronic kidney disease: Secondary | ICD-10-CM | POA: Diagnosis not present

## 2024-03-05 ENCOUNTER — Encounter: Payer: Self-pay | Admitting: Internal Medicine

## 2024-03-05 ENCOUNTER — Ambulatory Visit (INDEPENDENT_AMBULATORY_CARE_PROVIDER_SITE_OTHER): Payer: Medicare Other | Admitting: Internal Medicine

## 2024-03-05 VITALS — BP 160/68 | HR 75 | Temp 97.9°F | Ht 62.0 in | Wt 167.0 lb

## 2024-03-05 DIAGNOSIS — Z860100 Personal history of colon polyps, unspecified: Secondary | ICD-10-CM

## 2024-03-05 DIAGNOSIS — E782 Mixed hyperlipidemia: Secondary | ICD-10-CM | POA: Diagnosis not present

## 2024-03-05 DIAGNOSIS — R7303 Prediabetes: Secondary | ICD-10-CM | POA: Insufficient documentation

## 2024-03-05 DIAGNOSIS — I10 Essential (primary) hypertension: Secondary | ICD-10-CM

## 2024-03-05 DIAGNOSIS — M150 Primary generalized (osteo)arthritis: Secondary | ICD-10-CM | POA: Diagnosis not present

## 2024-03-05 DIAGNOSIS — Z0000 Encounter for general adult medical examination without abnormal findings: Secondary | ICD-10-CM | POA: Diagnosis not present

## 2024-03-05 DIAGNOSIS — N1832 Chronic kidney disease, stage 3b: Secondary | ICD-10-CM

## 2024-03-05 LAB — CBC
HCT: 32.8 % — ABNORMAL LOW (ref 36.0–46.0)
Hemoglobin: 11.1 g/dL — ABNORMAL LOW (ref 12.0–15.0)
MCHC: 33.9 g/dL (ref 30.0–36.0)
MCV: 87.4 fl (ref 78.0–100.0)
Platelets: 489 K/uL — ABNORMAL HIGH (ref 150.0–400.0)
RBC: 3.75 Mil/uL — ABNORMAL LOW (ref 3.87–5.11)
RDW: 13.6 % (ref 11.5–15.5)
WBC: 7.1 K/uL (ref 4.0–10.5)

## 2024-03-05 LAB — LIPID PANEL
Cholesterol: 181 mg/dL (ref 0–200)
HDL: 29.8 mg/dL — ABNORMAL LOW (ref >39.00)
LDL Cholesterol: 115 mg/dL — ABNORMAL HIGH (ref 0–99)
NonHDL: 150.75
Total CHOL/HDL Ratio: 6
Triglycerides: 179 mg/dL — ABNORMAL HIGH (ref 0.0–149.0)
VLDL: 35.8 mg/dL (ref 0.0–40.0)

## 2024-03-05 LAB — HEMOGLOBIN A1C: Hgb A1c MFr Bld: 6.2 % (ref 4.6–6.5)

## 2024-03-05 MED ORDER — METOPROLOL SUCCINATE ER 100 MG PO TB24: 100.0000 mg | ORAL_TABLET | Freq: Every day | ORAL | 3 refills | Status: AC

## 2024-03-05 MED ORDER — AMLODIPINE BESYLATE 10 MG PO TABS: 10.0000 mg | ORAL_TABLET | Freq: Every day | ORAL | 3 refills | Status: AC

## 2024-03-05 MED ORDER — LISINOPRIL 20 MG PO TABS: 20.0000 mg | ORAL_TABLET | Freq: Every day | ORAL | 3 refills | Status: AC

## 2024-03-05 NOTE — Assessment & Plan Note (Signed)
 Checking HgA1c and adjust as needed.

## 2024-03-05 NOTE — Assessment & Plan Note (Signed)
 BP at goal at home. Elevated slightly here today. She will continue lisinopril , amlodipine , metoprolol . Refills done. Labs with nephrology stable July 2025 will not recheck today.

## 2024-03-05 NOTE — Assessment & Plan Note (Signed)
 Flu shot yearly. Pneumonia complete. Shingrix complete. Tetanus up to date. Colonoscopy declines. Mammogram up to date, pap smear aged out and dexa complete. Counseled about sun safety and mole surveillance. Counseled about the dangers of distracted driving. Given 10 year screening recommendations.

## 2024-03-05 NOTE — Assessment & Plan Note (Signed)
 Checking lipid panel and adjust as needed.

## 2024-03-05 NOTE — Assessment & Plan Note (Signed)
 Stable labs with nephrology July 2025. BP at goal at home on lisinopril  20 mg daily and amlodipine  10 mg daily and metoprolol  100 mg daily. Continue.

## 2024-03-05 NOTE — Assessment & Plan Note (Signed)
 Controlled with movement. Does not take otc rarely uses otc topicals.

## 2024-03-05 NOTE — Assessment & Plan Note (Signed)
Declines colonoscopy today.

## 2024-03-05 NOTE — Progress Notes (Signed)
   Subjective:   Patient ID: Sara Rice, female    DOB: [birth year], 78 y.o.   MRN: [id number]  The patient is here for physical. Pertinent topics discussed: Discussed the use of AI scribe software for clinical note transcription with the patient, who gave verbal consent to proceed.  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 Her lisinopril  dosage was recently adjusted to help manage her blood pressure.  Her hearing is managed with hearing aids, which she wears regularly. She has a history of trace glaucoma and is considering having her vision checked this year.  PMH, [facility], social history reviewed and updated  Review of Systems  Constitutional: Negative.   HENT: Negative.    Eyes: Negative.   Respiratory:  Negative for cough, chest tightness and shortness of breath.   Cardiovascular:  Negative for chest pain, palpitations and leg swelling.  Gastrointestinal:  Negative for abdominal distention, abdominal pain, constipation, diarrhea, nausea and vomiting.  Musculoskeletal: Negative.   Skin: Negative.   Neurological: Negative.   Psychiatric/Behavioral: Negative.      Objective:  Physical Exam Constitutional:      Appearance: She is well-developed.  HENT:     Head: Normocephalic and atraumatic.  Cardiovascular:     Rate and Rhythm: Normal rate and regular rhythm.  Pulmonary:     Effort: Pulmonary effort is normal. No respiratory distress.     Breath sounds: Normal breath sounds. No wheezing or rales.  Abdominal:     General: Bowel sounds are normal. There is no distension.     Palpations: Abdomen is soft.     Tenderness: There is no abdominal tenderness. There is no rebound.  Musculoskeletal:     Cervical back: Normal range of motion.  Skin:    General: Skin is warm and dry.  Neurological:      Mental Status: She is alert and oriented to person, place, and time.     Coordination: Coordination normal.     Vitals:   03/05/24 1046 03/05/24 1048  BP: (!) 160/68 (!) 160/68  Pulse: 75   Temp: 97.9 F (36.6 C)   TempSrc: Oral   SpO2: 99%   Weight: 167 lb (75.8 kg)   Height: 5' 2 (1.575 m)     Assessment & Plan:

## 2024-03-12 ENCOUNTER — Ambulatory Visit: Payer: Self-pay | Admitting: Internal Medicine

## 2024-03-16 NOTE — Progress Notes (Signed)
 Called patient and went over labs there were no concerns pt verbalized she understood

## 2024-06-25 ENCOUNTER — Other Ambulatory Visit: Payer: Self-pay | Admitting: Internal Medicine

## 2025-01-16 ENCOUNTER — Ambulatory Visit

## 2025-03-11 ENCOUNTER — Encounter: Admitting: Internal Medicine
